# Patient Record
Sex: Female | Born: 1940
Health system: Southern US, Community
[De-identification: ages and names within clinical notes are randomized; demographics above are authoritative.]

## PROBLEM LIST (undated history)

## (undated) DIAGNOSIS — I2510 Atherosclerotic heart disease of native coronary artery without angina pectoris: Secondary | ICD-10-CM

## (undated) DIAGNOSIS — J45909 Unspecified asthma, uncomplicated: Secondary | ICD-10-CM

## (undated) DIAGNOSIS — M797 Fibromyalgia: Secondary | ICD-10-CM

## (undated) DIAGNOSIS — E785 Hyperlipidemia, unspecified: Secondary | ICD-10-CM

## (undated) DIAGNOSIS — Z8719 Personal history of other diseases of the digestive system: Secondary | ICD-10-CM

## (undated) DIAGNOSIS — K219 Gastro-esophageal reflux disease without esophagitis: Secondary | ICD-10-CM

## (undated) DIAGNOSIS — J449 Chronic obstructive pulmonary disease, unspecified: Secondary | ICD-10-CM

## (undated) DIAGNOSIS — I10 Essential (primary) hypertension: Secondary | ICD-10-CM

## (undated) DIAGNOSIS — C4490 Unspecified malignant neoplasm of skin, unspecified: Secondary | ICD-10-CM

## (undated) DIAGNOSIS — F419 Anxiety disorder, unspecified: Secondary | ICD-10-CM

## (undated) DIAGNOSIS — M1990 Unspecified osteoarthritis, unspecified site: Secondary | ICD-10-CM

## (undated) DIAGNOSIS — F32A Depression, unspecified: Secondary | ICD-10-CM

## (undated) DIAGNOSIS — R1310 Dysphagia, unspecified: Secondary | ICD-10-CM

## (undated) DIAGNOSIS — Z973 Presence of spectacles and contact lenses: Secondary | ICD-10-CM

## (undated) DIAGNOSIS — Z972 Presence of dental prosthetic device (complete) (partial): Secondary | ICD-10-CM

## (undated) DIAGNOSIS — S4292XA Fracture of left shoulder girdle, part unspecified, initial encounter for closed fracture: Secondary | ICD-10-CM

## (undated) DIAGNOSIS — J189 Pneumonia, unspecified organism: Secondary | ICD-10-CM

## (undated) DIAGNOSIS — R0600 Dyspnea, unspecified: Secondary | ICD-10-CM

## (undated) HISTORY — PX: UPPER GI ENDOSCOPY: SHX6162

## (undated) HISTORY — PX: HEMORRHOID SURGERY: SHX153

## (undated) HISTORY — PX: COLONOSCOPY: SHX174

## (undated) HISTORY — PX: ABDOMINAL HYSTERECTOMY: SHX81

---

## 1999-05-26 ENCOUNTER — Encounter: Payer: Self-pay | Admitting: Internal Medicine

## 1999-05-26 ENCOUNTER — Encounter: Admission: RE | Admit: 1999-05-26 | Discharge: 1999-05-26 | Payer: Self-pay | Admitting: Internal Medicine

## 1999-07-11 ENCOUNTER — Ambulatory Visit (HOSPITAL_BASED_OUTPATIENT_CLINIC_OR_DEPARTMENT_OTHER): Admission: RE | Admit: 1999-07-11 | Discharge: 1999-07-11 | Payer: Self-pay | Admitting: Surgery

## 1999-07-11 ENCOUNTER — Encounter (INDEPENDENT_AMBULATORY_CARE_PROVIDER_SITE_OTHER): Payer: Self-pay | Admitting: *Deleted

## 1999-09-21 ENCOUNTER — Encounter: Payer: Self-pay | Admitting: Emergency Medicine

## 1999-09-21 ENCOUNTER — Inpatient Hospital Stay (HOSPITAL_COMMUNITY): Admission: EM | Admit: 1999-09-21 | Discharge: 1999-09-24 | Payer: Self-pay | Admitting: Emergency Medicine

## 2000-01-28 ENCOUNTER — Other Ambulatory Visit (HOSPITAL_COMMUNITY): Admission: RE | Admit: 2000-01-28 | Discharge: 2000-02-16 | Payer: Self-pay | Admitting: Psychiatry

## 2000-06-29 ENCOUNTER — Ambulatory Visit (HOSPITAL_COMMUNITY): Admission: RE | Admit: 2000-06-29 | Discharge: 2000-06-29 | Payer: Self-pay | Admitting: *Deleted

## 2000-06-29 ENCOUNTER — Encounter (INDEPENDENT_AMBULATORY_CARE_PROVIDER_SITE_OTHER): Payer: Self-pay | Admitting: Specialist

## 2000-08-19 ENCOUNTER — Ambulatory Visit: Admission: RE | Admit: 2000-08-19 | Discharge: 2000-08-19 | Payer: Self-pay | Admitting: Pulmonary Disease

## 2000-09-16 ENCOUNTER — Ambulatory Visit (HOSPITAL_COMMUNITY): Admission: RE | Admit: 2000-09-16 | Discharge: 2000-09-16 | Payer: Self-pay | Admitting: Pulmonary Disease

## 2000-09-16 ENCOUNTER — Encounter: Payer: Self-pay | Admitting: Pulmonary Disease

## 2003-07-02 ENCOUNTER — Encounter: Admission: RE | Admit: 2003-07-02 | Discharge: 2003-07-02 | Payer: Self-pay | Admitting: Internal Medicine

## 2004-07-25 ENCOUNTER — Encounter: Admission: RE | Admit: 2004-07-25 | Discharge: 2004-07-25 | Payer: Self-pay | Admitting: Internal Medicine

## 2004-08-08 ENCOUNTER — Ambulatory Visit (HOSPITAL_COMMUNITY): Admission: RE | Admit: 2004-08-08 | Discharge: 2004-08-08 | Payer: Self-pay | Admitting: *Deleted

## 2005-03-30 ENCOUNTER — Emergency Department (HOSPITAL_COMMUNITY): Admission: EM | Admit: 2005-03-30 | Discharge: 2005-03-30 | Payer: Self-pay | Admitting: *Deleted

## 2007-11-30 ENCOUNTER — Encounter (HOSPITAL_COMMUNITY): Admission: RE | Admit: 2007-11-30 | Discharge: 2007-12-22 | Payer: Self-pay | Admitting: Endocrinology

## 2008-03-04 ENCOUNTER — Emergency Department (HOSPITAL_COMMUNITY): Admission: EM | Admit: 2008-03-04 | Discharge: 2008-03-04 | Payer: Self-pay | Admitting: Emergency Medicine

## 2009-12-29 ENCOUNTER — Observation Stay (HOSPITAL_COMMUNITY): Admission: EM | Admit: 2009-12-29 | Discharge: 2009-12-29 | Payer: Self-pay | Admitting: Emergency Medicine

## 2009-12-29 ENCOUNTER — Emergency Department (HOSPITAL_COMMUNITY): Admission: EM | Admit: 2009-12-29 | Discharge: 2009-12-29 | Payer: Self-pay | Admitting: Family Medicine

## 2010-05-09 ENCOUNTER — Inpatient Hospital Stay (INDEPENDENT_AMBULATORY_CARE_PROVIDER_SITE_OTHER)
Admission: RE | Admit: 2010-05-09 | Discharge: 2010-05-09 | Disposition: A | Discharge status: Home / Self Care | Payer: BC Managed Care – HMO | Source: Ambulatory Visit | Attending: Family Medicine | Admitting: Family Medicine

## 2010-05-09 DIAGNOSIS — T6391XA Toxic effect of contact with unspecified venomous animal, accidental (unintentional), initial encounter: Secondary | ICD-10-CM

## 2010-06-19 LAB — POCT I-STAT, CHEM 8
Creatinine, Ser: 0.7 mg/dL (ref 0.4–1.2)
HCT: 47 % — ABNORMAL HIGH (ref 36.0–46.0)
Hemoglobin: 16 g/dL — ABNORMAL HIGH (ref 12.0–15.0)
Potassium: 3.8 mEq/L (ref 3.5–5.1)
Sodium: 140 mEq/L (ref 135–145)
TCO2: 25 mmol/L (ref 0–100)

## 2010-06-19 LAB — CBC
MCH: 29.7 pg (ref 26.0–34.0)
MCHC: 33.7 g/dL (ref 30.0–36.0)
MCV: 88.3 fL (ref 78.0–100.0)
Platelets: 219 10*3/uL (ref 150–400)
RBC: 5.11 MIL/uL (ref 3.87–5.11)
RDW: 13.9 % (ref 11.5–15.5)

## 2010-06-19 LAB — DIFFERENTIAL
Basophils Relative: 1 % (ref 0–1)
Eosinophils Absolute: 0 10*3/uL (ref 0.0–0.7)
Eosinophils Relative: 0 % (ref 0–5)
Lymphs Abs: 0.7 10*3/uL (ref 0.7–4.0)
Neutrophils Relative %: 85 % — ABNORMAL HIGH (ref 43–77)

## 2010-06-19 LAB — POCT CARDIAC MARKERS: Myoglobin, poc: 166 ng/mL (ref 12–200)

## 2010-08-22 NOTE — Discharge Summary (Signed)
. [facility]  Patient:    Elizabeth Velazquez, Elizabeth Velazquez                      MRN: [id number] Adm. Date:  [id number] Disc. Date: [id number] Attending:  Londell Moh CC:         Soyla Murphy. Renne Crigler, M.D.   Discharge Summary  DISCHARGE DIAGNOSIS:  Acute bronchitis.  SECONDARY DIAGNOSIS:  Tobacco abuse.  The patient was admitted to [facility] D. Renne Crigler, M.D.  CONSULTANTS:  None.  PROCEDURES:  None.  HISTORY OF PRESENT ILLNESS:  Please see Dr. Shellia Cleverly history and physical.  The patient is a 70 year old woman with a history of tobacco abuse who came in with acute exacerbation of asthmatic bronchitis.  The patient was found to have ABG showing partial pressure of oxygen of 58 on room air and labored breathing.  HOSPITAL COURSE:  #1 -  ASTHMATIC BRONCHITIS:  Mrs. Elizabeth Velazquez was admitted to the hospital for treatment of an asthmatic bronchitis.  Initial pO2 was 58 mmHg on room air with O2 saturations approximately 90% with labored breathing. The patient was found to have a slight leukocytosis with 11,000 white cells.  The patient was started on IV Solu-Medrol and azithromycin.  The patient was afebrile.  After several days of IV steroids, the patients lung exam which initially had been very wheezy and bronchospastic, improved; and the patient had decreased work of breathing.  The patient was changed to oral prednisone and was to continue azithromycin Z-pack until completion at the time of discharge.  The patient was much improved at the time of discharge.  O2 saturations were 96% on room air and work of breathing was within normal range.  The patient was to be tapered on steroids over one week.  #2 - HYPOKALEMIA:  The patient did have a decreased potassium on initial evaluation. The reasons for this are not entirely clear.  Her potassium was 3.2.  The patient had no symptoms.  At discharge, the patients potassium was 3.3. This will need to be followed up as an  outpatient.  #3 - HYPERGLYCEMIA:  The patient was found to have a slightly elevated blood sugar on initial evaluation.  Her glucose was 135.  At the time of discharge the patients glucose was 267.  The patient was not on glucose lowering agents.  It was felt that she was probably borderline and unfortunately the steroids probably caused an increase in her blood glucose.  This will need to be followed up as an outpatient. The patient is probably borderline diabetic and may need pharmacologic therapy in the very near future.  My suspicion is that once the patient is discontinued off the steroids, then her sugar should get to a more normal range; however, this will need to be followed up.  #4 - ANXIETY:  The patient has a history of depression/anxiety.  The patient is on Effexor and this should be continued as an outpatient.  DISCHARGE MEDICATIONS: 1. Prednisone taper 60 mg over six days. 2. Albuterol and Atrovent MDIs two puffs q.6h. 3. Azithromycin 250 mg q.d. x 2 days. 4. Valium 5 mg t.i.d. p.r.n.  FOLLOW-UP:  The patient will see Dr. Renne Crigler in one to two weeks.  CONDITION ON DISCHARGE:  Improved. DD:  11/20/99 TD:  11/21/99 Job: [id number]

## 2010-08-22 NOTE — Op Note (Signed)
[place]. [facility]  Patient:    Elizabeth Velazquez, Elizabeth Velazquez                      MRN: [id number] Proc. Date: 06/29/00 Adm. Date:  [id number] Attending:  Sabino Gasser                           Operative Report  PROCEDURE:  Colonoscopy.  INDICATIONS:  Colon polyp.  ANESTHESIA:  Demerol 60 mg, Versed 6 mg.  DESCRIPTION OF PROCEDURE:  With the patient mildly sedated in the left lateral decubitus position, the Olympus videoscopic colonoscope was inserted in the rectum and passed under direct vision to the cecum.  The cecum identified by the ileocecal valve and appendiceal valve both of which are photographed. From this point, the colonoscope was slowly withdrawn taking circumferential views of the entire colonic mucosa stopping to photograph diverticula in the sigmoid colon until we reached the rectum which appeared normal on direct view and showed a polyp on retroflex view and direct view which was then photographed, biopsied, using hot biopsy forceps technique and removed.  The endoscope was withdrawn.  The patients vital signs and pulse oximeter remained stable.  The patient tolerated the procedure well with no apparent complications.  FINDINGS:  Polyp of rectum removed.  Await biopsy report.  The patient will call me for results and follow up with me as an outpatient. DD:  06/29/00 TD:  06/29/00 Job: [id number]

## 2010-08-22 NOTE — Op Note (Signed)
[place]. [facility]  Patient:    Elizabeth Velazquez, Elizabeth Velazquez                      MRN: [id number] Proc. Date: 07/11/99 Adm. Date:  [id number] Attending:  Andre Lefort CC:         Soyla Murphy. Renne Crigler, M.D.             Jeralyn Ruths, M.D.                           Operative Report  DATE OF BIRTH:  10-23-1940  PREOPERATIVE DIAGNOSIS:  Probable papilloma, left breast nipple.  POSTOPERATIVE DIAGNOSIS:  Probable papilloma, left breast nipple, final pathology pending.  PROCEDURE:  Excisional breast biopsy of left breast.  SURGEON:  Sandria Bales. Ezzard Standing, M.D.  FIRST ASSISTANT:  None.  ANESTHESIA:  15 cc of 1% Xylocaine with MAC.  COMPLICATIONS:  None.  INDICATIONS FOR PROCEDURE:  The patient is a 70 year old white female who presents with a mass in her left nipple areola area.  She had a negative mammogram.  An ultrasound showed this to be a solid appearing mass which transilluminated well. The feeling is that this is probable a papilloma.  She comes for excision of this mass.  DESCRIPTION OF PROCEDURE:  The patient was placed in the supine position.  The eft breast was prepped with Betadine solution and sterilely draped.  The patient had a 1 cm mass right at the 12 oclock position of the left nipple in the areolar complex.  The skin was infiltrated with approximately 20 cc of 1% Xylocaine. An elliptical incision was made, excising the mass with attempt to try to get some  normal tissue around it.  This was sent to pathology.  THe wound was then closed with interrupted 5-0 Vicryl suture in the subcutaneous tissue then 5-0 Vicryl running suture in the skin layer, painted with tincture of benzoin and Steri-Strips and sterilely dressed.  The patient tolerated the procedure well and was transported to the recovery room in good condition. DD:  07/11/99 TD:  07/11/99 Job: [id number]

## 2010-08-22 NOTE — Op Note (Signed)
[name], Elizabeth Velazquez NO.:  [id number]   MEDICAL RECORD NO.:  [id number]          PATIENT TYPE:  AMB   LOCATION:  ENDO                         FACILITY:  [facility]   PHYSICIAN:  Georgiana Spinner, M.D.    DATE OF BIRTH:  1941-02-26   DATE OF PROCEDURE:  08/08/2004  DATE OF DISCHARGE:                                 OPERATIVE REPORT   PROCEDURE:  Upper endoscopy.   INDICATIONS FOR PROCEDURE:  Dysphagia.   ANESTHESIA:  Demerol 40, Versed 4 mg.   DESCRIPTION OF PROCEDURE:  With the patient mildly sedated in the left  lateral decubitus position, the Olympus videoscopic endoscope was inserted  in the mouth and passed under direct vision through the esophagus.  There  was no evidence of Barrett's.  We entered into the stomach, and there was  food seen in the fundus.  In fact, most of the stomach, body, and antrum  were visualized, and there was food actually refluxing back from the  duodenum through the pylorus which were able to enter and get to the second  portion of the duodenum.  From this point, the endoscope was withdrawn,  taking circumferential views of the duodenal mucosa as best as we could  visualize until the endoscope had been pulled back into the stomach, placed  in retroflexion, viewing the stomach from below.  The endoscope was then  straightened and withdrawn, taking circumferential views of the remaining  gastric and esophageal mucosa.  The patient's vital signs and pulse oximetry  remained stable.   The patient tolerated the procedure well without apparent complications.   FINDINGS:  Food left in the stomach.  The patient's history is that she had  not eaten since midnight, so therefore she has a significant degree of  gastroparesis.  We will discuss this further with her and consider a therapy  directed towards this.      GMO/MEDQ  D:  08/08/2004  T:  08/08/2004  Job:  [id number]

## 2010-09-23 ENCOUNTER — Emergency Department (HOSPITAL_COMMUNITY)
Admission: EM | Admit: 2010-09-23 | Discharge: 2010-09-23 | Disposition: A | Discharge status: Home / Self Care | Payer: Medicare Other | Attending: Emergency Medicine | Admitting: Emergency Medicine

## 2010-09-23 DIAGNOSIS — J449 Chronic obstructive pulmonary disease, unspecified: Secondary | ICD-10-CM | POA: Insufficient documentation

## 2010-09-23 DIAGNOSIS — L500 Allergic urticaria: Secondary | ICD-10-CM | POA: Insufficient documentation

## 2010-09-23 DIAGNOSIS — L2989 Other pruritus: Secondary | ICD-10-CM | POA: Insufficient documentation

## 2010-09-23 DIAGNOSIS — R0602 Shortness of breath: Secondary | ICD-10-CM | POA: Insufficient documentation

## 2010-09-23 DIAGNOSIS — L298 Other pruritus: Secondary | ICD-10-CM | POA: Insufficient documentation

## 2010-09-23 DIAGNOSIS — IMO0001 Reserved for inherently not codable concepts without codable children: Secondary | ICD-10-CM | POA: Insufficient documentation

## 2010-09-23 DIAGNOSIS — J4489 Other specified chronic obstructive pulmonary disease: Secondary | ICD-10-CM | POA: Insufficient documentation

## 2010-09-23 DIAGNOSIS — T63461A Toxic effect of venom of wasps, accidental (unintentional), initial encounter: Secondary | ICD-10-CM | POA: Insufficient documentation

## 2010-09-23 DIAGNOSIS — T6391XA Toxic effect of contact with unspecified venomous animal, accidental (unintentional), initial encounter: Secondary | ICD-10-CM | POA: Insufficient documentation

## 2011-05-21 ENCOUNTER — Other Ambulatory Visit: Payer: Self-pay | Admitting: Gastroenterology

## 2012-03-05 ENCOUNTER — Emergency Department (HOSPITAL_COMMUNITY)
Admission: EM | Admit: 2012-03-05 | Discharge: 2012-03-05 | Disposition: A | Discharge status: Home / Self Care | Payer: Medicare Other | Source: Home / Self Care | Attending: Emergency Medicine | Admitting: Emergency Medicine

## 2012-03-05 ENCOUNTER — Encounter (HOSPITAL_COMMUNITY): Payer: Self-pay | Admitting: Emergency Medicine

## 2012-03-05 DIAGNOSIS — F411 Generalized anxiety disorder: Secondary | ICD-10-CM

## 2012-03-05 DIAGNOSIS — L509 Urticaria, unspecified: Secondary | ICD-10-CM

## 2012-03-05 DIAGNOSIS — F419 Anxiety disorder, unspecified: Secondary | ICD-10-CM

## 2012-03-05 HISTORY — DX: Anxiety disorder, unspecified: F41.9

## 2012-03-05 MED ORDER — TRIAMCINOLONE ACETONIDE 40 MG/ML IJ SUSP
INTRAMUSCULAR | Status: AC
  Filled 2012-03-05: qty 5

## 2012-03-05 MED ORDER — LORAZEPAM 1 MG PO TABS: 0.5000 mg | ORAL_TABLET | Freq: Three times a day (TID) | ORAL | Status: DC | PRN

## 2012-03-05 MED ORDER — PREDNISONE 10 MG PO TABS: 20.0000 mg | ORAL_TABLET | Freq: Two times a day (BID) | ORAL | Status: DC

## 2012-03-05 MED ORDER — TRIAMCINOLONE ACETONIDE 40 MG/ML IJ SUSP: 60.0000 mg | Freq: Once | INTRAMUSCULAR | Status: DC

## 2012-03-05 MED ORDER — TRIAMCINOLONE ACETONIDE 40 MG/ML IJ SUSP
40.0000 mg | Freq: Once | INTRAMUSCULAR | Status: AC
  Administered 2012-03-05: 40 mg via INTRAMUSCULAR

## 2012-03-05 NOTE — ED Notes (Signed)
Pt c/o hives x2 weeks but getting worse the last 3/4 days... Sx include: hives on hands, arms, face and describes itching as burning.Marland KitchenMarland KitchenDenies: fevers, vomiting, nauseas, diarrhea... States she just lost a very good friend and attributes the hives to nerves???

## 2012-03-05 NOTE — ED Provider Notes (Signed)
History     CSN: [id number]  Arrival date & time 03/05/12  0903   First MD Initiated Contact with Patient 03/05/12 [phone]      Chief Complaint  Patient presents with  . Urticaria    (Consider location/radiation/quality/duration/timing/severity/associated sxs/prior treatment) Patient is a 71 y.o. female presenting with urticaria. The history is provided by the patient.  Urticaria This is a new problem. The current episode started more than 1 week ago. The problem occurs daily. The problem has not changed since onset.Pertinent negatives include no chest pain, no abdominal pain, no headaches and no shortness of breath. Nothing aggravates the symptoms. Nothing relieves the symptoms. Treatments tried: Benadryl. The treatment provided mild relief.  Reports hx of hives with emotional distress, had a close friend pass recently.    Past Medical History  Diagnosis Date  . Anxiety     History reviewed. No pertinent past surgical history.  No family history on file.  History  Substance Use Topics  . Smoking status: Never Smoker   . Smokeless tobacco: Not on file  . Alcohol Use: No    OB History    Grav Para Term Preterm Abortions TAB SAB Ect Mult Living                  Review of Systems  Respiratory: Negative for shortness of breath.   Cardiovascular: Negative for chest pain.  Gastrointestinal: Negative for abdominal pain.  Skin: Positive for rash.  Neurological: Negative for headaches.  All other systems reviewed and are negative.    Allergies  Penicillins  Home Medications   Current Outpatient Rx  Name  Route  Sig  Dispense  Refill  . VENLAFAXINE HCL ER 150 MG PO CP24   Oral   Take 150 mg by mouth daily.         . ALBUTEROL SULFATE (2.5 MG/3ML) 0.083% IN NEBU   Nebulization   Take 2.5 mg by nebulization every 6 (six) hours as needed.         [place] IN   Inhalation   Inhale into the lungs.         Marland Kitchen FLEXERIL PO   Oral   Take by mouth.         Marland Kitchen LORAZEPAM 1 MG PO TABS   Oral   Take 0.5 tablets (0.5 mg total) by mouth 3 (three) times daily as needed for anxiety.   15 tablet   0   . PREDNISONE 10 MG PO TABS   Oral   Take 2 tablets (20 mg total) by mouth 2 (two) times daily.   6 tablet   0     BP 163/90  Pulse 90  Temp 98.9 F (37.2 C) (Oral)  Resp 18  SpO2 96%  Physical Exam  Nursing note and vitals reviewed. Constitutional: She is oriented to person, place, and time. Vital signs are normal. She appears well-developed and well-nourished. She is active and cooperative.  HENT:  Head: Normocephalic.  Right Ear: External ear normal.  Left Ear: External ear normal.  Nose: Nose normal.  Mouth/Throat: Oropharynx is clear and moist. No oropharyngeal exudate.  Eyes: Conjunctivae normal are normal. Pupils are equal, round, and reactive to light. No scleral icterus.  Neck: Trachea normal and normal range of motion. Neck supple.  Cardiovascular: Normal rate, regular rhythm, normal heart sounds and intact distal pulses.   No murmur heard. Pulmonary/Chest: Effort normal and breath sounds normal.  Lymphadenopathy:    She has no cervical  adenopathy.  Neurological: She is alert and oriented to person, place, and time. No cranial nerve deficit or sensory deficit.  Skin: Skin is warm and dry. Rash noted.       Erythematous rash on face and neck  Psychiatric: She has a normal mood and affect. Her speech is normal and behavior is normal. Judgment and thought content normal. Cognition and memory are normal.    ED Course  Procedures (including critical care time)  Labs Reviewed - No data to display No results found.   1. Anxiety   2. Hives       MDM  Take medication as prescribed, continue benadryl, follow up with your PCP this week for effective management of situational anxiety.          Johnsie Kindred, NP 03/05/12 1039

## 2012-03-05 NOTE — ED Provider Notes (Signed)
Medical screening examination/treatment/procedure(s) were performed by non-physician practitioner and as supervising physician I was immediately available for consultation/collaboration.  Leslee Home, M.D.   Reuben Likes, MD 03/05/12 1046

## 2014-09-07 ENCOUNTER — Emergency Department (HOSPITAL_COMMUNITY)
Admission: EM | Admit: 2014-09-07 | Discharge: 2014-09-07 | Disposition: A | Discharge status: Home / Self Care | Payer: Medicare Other | Attending: Emergency Medicine | Admitting: Emergency Medicine

## 2014-09-07 ENCOUNTER — Encounter (HOSPITAL_COMMUNITY): Payer: Self-pay | Admitting: *Deleted

## 2014-09-07 DIAGNOSIS — Y998 Other external cause status: Secondary | ICD-10-CM | POA: Insufficient documentation

## 2014-09-07 DIAGNOSIS — T7840XA Allergy, unspecified, initial encounter: Secondary | ICD-10-CM

## 2014-09-07 DIAGNOSIS — Y9289 Other specified places as the place of occurrence of the external cause: Secondary | ICD-10-CM | POA: Insufficient documentation

## 2014-09-07 DIAGNOSIS — Y9389 Activity, other specified: Secondary | ICD-10-CM | POA: Insufficient documentation

## 2014-09-07 DIAGNOSIS — Z880 Allergy status to penicillin: Secondary | ICD-10-CM | POA: Diagnosis not present

## 2014-09-07 DIAGNOSIS — Z7951 Long term (current) use of inhaled steroids: Secondary | ICD-10-CM | POA: Diagnosis not present

## 2014-09-07 DIAGNOSIS — F419 Anxiety disorder, unspecified: Secondary | ICD-10-CM | POA: Diagnosis not present

## 2014-09-07 DIAGNOSIS — J449 Chronic obstructive pulmonary disease, unspecified: Secondary | ICD-10-CM | POA: Insufficient documentation

## 2014-09-07 DIAGNOSIS — Z79899 Other long term (current) drug therapy: Secondary | ICD-10-CM | POA: Diagnosis not present

## 2014-09-07 DIAGNOSIS — T63464A Toxic effect of venom of wasps, undetermined, initial encounter: Secondary | ICD-10-CM | POA: Diagnosis present

## 2014-09-07 MED ORDER — PREDNISONE 20 MG PO TABS: ORAL_TABLET | ORAL | Status: DC

## 2014-09-07 NOTE — ED Notes (Signed)
Patient returned to nurse first desk, still jittering but sts is ready to go. Patient informed she is next to be taken back unless more emergent condition enters. Patient agreeable to stay and RN informed to return if she feels she cant wait any longer.

## 2014-09-07 NOTE — ED Notes (Addendum)
Pt appears to be in no distress. Pt states she's ready to go home. Pt continues to remain jittery and is shaking, pt states this is not normal for her.

## 2014-09-07 NOTE — Discharge Instructions (Signed)
Allergies °Allergies may happen from anything your body is sensitive to. This may be food, medicines, pollens, chemicals, and nearly anything around you in everyday life that produces allergens. An allergen is anything that causes an allergy producing substance. Heredity is often a factor in causing these problems. This means you may have some of the same allergies as your parents. °Food allergies happen in all age groups. Food allergies are some of the most severe and life threatening. Some common food allergies are cow's milk, seafood, eggs, nuts, wheat, and soybeans. °SYMPTOMS  °· Swelling around the mouth. °· An itchy red rash or hives. °· Vomiting or diarrhea. °· Difficulty breathing. °SEVERE ALLERGIC REACTIONS ARE LIFE-THREATENING. °This reaction is called anaphylaxis. It can cause the mouth and throat to swell and cause difficulty with breathing and swallowing. In severe reactions only a trace amount of food (for example, peanut oil in a salad) may cause death within seconds. °Seasonal allergies occur in all age groups. These are seasonal because they usually occur during the same season every year. They may be a reaction to molds, grass pollens, or tree pollens. Other causes of problems are house dust mite allergens, pet dander, and mold spores. The symptoms often consist of nasal congestion, a runny itchy nose associated with sneezing, and tearing itchy eyes. There is often an associated itching of the mouth and ears. The problems happen when you come in contact with pollens and other allergens. Allergens are the particles in the air that the body reacts to with an allergic reaction. This causes you to release allergic antibodies. Through a chain of events, these eventually cause you to release histamine into the blood stream. Although it is meant to be protective to the body, it is this release that causes your discomfort. This is why you were given anti-histamines to feel better.  If you are unable to  pinpoint the offending allergen, it may be determined by skin or blood testing. Allergies cannot be cured but can be controlled with medicine. °Hay fever is a collection of all or some of the seasonal allergy problems. It may often be treated with simple over-the-counter medicine such as diphenhydramine. Take medicine as directed. Do not drink alcohol or drive while taking this medicine. Check with your caregiver or package insert for child dosages. °If these medicines are not effective, there are many new medicines your caregiver can prescribe. Stronger medicine such as nasal spray, eye drops, and corticosteroids may be used if the first things you try do not work well. Other treatments such as immunotherapy or desensitizing injections can be used if all else fails. Follow up with your caregiver if problems continue. These seasonal allergies are usually not life threatening. They are generally more of a nuisance that can often be handled using medicine. °HOME CARE INSTRUCTIONS  °· If unsure what causes a reaction, keep a diary of foods eaten and symptoms that follow. Avoid foods that cause reactions. °· If hives or rash are present: °¨ Take medicine as directed. °¨ You may use an over-the-counter antihistamine (diphenhydramine) for hives and itching as needed. °¨ Apply cold compresses (cloths) to the skin or take baths in cool water. Avoid hot baths or showers. Heat will make a rash and itching worse. °· If you are severely allergic: °¨ Following a treatment for a severe reaction, hospitalization is often required for closer follow-up. °¨ Wear a medic-alert bracelet or necklace stating the allergy. °¨ You and your family must learn how to give adrenaline or use   an anaphylaxis kit.  If you have had a severe reaction, always carry your anaphylaxis kit or EpiPen with you. Use this medicine as directed by your caregiver if a severe reaction is occurring. Failure to do so could have a fatal outcome. SEEK MEDICAL  CARE IF:  You suspect a food allergy. Symptoms generally happen within 30 minutes of eating a food.  Your symptoms have not gone away within 2 days or are getting worse.  You develop new symptoms.  You want to retest yourself or your child with a food or drink you think causes an allergic reaction. Never do this if an anaphylactic reaction to that food or drink has happened before. Only do this under the care of a caregiver. SEEK IMMEDIATE MEDICAL CARE IF:   You have difficulty breathing, are wheezing, or have a tight feeling in your chest or throat.  You have a swollen mouth, or you have hives, swelling, or itching all over your body.  You have had a severe reaction that has responded to your anaphylaxis kit or an EpiPen. These reactions may return when the medicine has worn off. These reactions should be considered life threatening. MAKE SURE YOU:   Understand these instructions.  Will watch your condition.  Will get help right away if you are not doing well or get worse. Document Released: 06/16/2002 Document Revised: 07/18/2012 Document Reviewed: 11/21/2007 [facility] Patient Information 2015 [place], [place]. This information is not intended to replace advice given to you by your health care provider. Make sure you discuss any questions you have with your health care provider.   Please take your medication as directed. Please follow-up with her primary care for further evaluation and management of her symptoms. Return to ED for new or worsening symptoms.

## 2014-09-07 NOTE — ED Provider Notes (Signed)
CSN: [id number]     Arrival date & time 09/07/14  1421 History   First MD Initiated Contact with Patient 09/07/14 1536     Chief Complaint  Patient presents with  . Allergic Reaction     (Consider location/radiation/quality/duration/timing/severity/associated sxs/prior Treatment) HPI Elizabeth Velazquez is a 74 y.o. female with history of COPD, comes in for evaluation of wasp sting. Patient states approximately 2:00 this afternoon she was stung on her right side of her chest. She reports using her EpiPen within a few minutes. She reports a known allergy to bee stings in general, but has never used an EpiPen before. She reports she was stung "many many years ago", was seen in the Hospital and was told her "lungs were closing up" and that she needed to carry an EpiPen. She at no point experienced shortness of breath, difficulties breathing, nausea or vomiting, abdominal pain or cramping, skin rash. Denies any pain or discomfort now. States her son will be staying with her this evening and she is ready to go home.  Past Medical History  Diagnosis Date  . Anxiety    History reviewed. No pertinent past surgical history. History reviewed. No pertinent family history. History  Substance Use Topics  . Smoking status: Never Smoker   . Smokeless tobacco: Not on file  . Alcohol Use: No   OB History    No data available     Review of Systems A 10 point review of systems was completed and was negative except for pertinent positives and negatives as mentioned in the history of present illness     Allergies  Penicillins  Home Medications   Prior to Admission medications   Medication Sig Start Date End Date Taking? Authorizing Provider  albuterol (PROVENTIL HFA;VENTOLIN HFA) 108 (90 BASE) MCG/ACT inhaler Inhale 1 puff into the lungs every 6 (six) hours as needed for shortness of breath.   Yes Historical Provider, MD  albuterol (PROVENTIL) (2.5 MG/3ML) 0.083% nebulizer solution Take 2.5 mg by  nebulization every 6 (six) hours as needed for shortness of breath.    Yes Historical Provider, MD  Budesonide-Formoterol Fumarate (SYMBICORT IN) Inhale 1 puff into the lungs daily.    Yes Historical Provider, MD  Cholecalciferol (VITAMIN D3) 2000 UNITS TABS Take 1 tablet by mouth daily.   Yes Historical Provider, MD  HYDROcodone-acetaminophen (NORCO/VICODIN) 5-325 MG per tablet Take 1 tablet by mouth every 6 (six) hours as needed for moderate pain.   Yes Historical Provider, MD  magnesium gluconate (MAGONATE) 500 MG tablet Take 500 mg by mouth daily.   Yes Historical Provider, MD  venlafaxine XR (EFFEXOR-XR) 150 MG 24 hr capsule Take 150 mg by mouth daily.   Yes Historical Provider, MD  LORazepam (ATIVAN) 1 MG tablet Take 0.5 tablets (0.5 mg total) by mouth 3 (three) times daily as needed for anxiety. Patient not taking: Reported on 09/07/2014 03/05/12   Awilda Metro, NP  predniSONE (DELTASONE) 20 MG tablet 3 tabs po day one, then 2 tabs daily x 4 days 09/07/14   Comer Locket, PA-C   BP 115/60 mmHg  Pulse 91  Temp(Src) 98.1 F (36.7 C) (Oral)  Resp 20  Ht 4\' 10"  (1.473 m)  Wt 122 lb (55.339 kg)  BMI 25.51 kg/m2  SpO2 96% Physical Exam  Constitutional: She is oriented to person, place, and time. She appears well-developed and well-nourished.  HENT:  Head: Normocephalic and atraumatic.  Mouth/Throat: Oropharynx is clear and moist.  Eyes: Conjunctivae are normal. Pupils  are equal, round, and reactive to light. Right eye exhibits no discharge. Left eye exhibits no discharge. No scleral icterus.  Neck: Neck supple.  Cardiovascular: Normal rate, regular rhythm and normal heart sounds.   Pulmonary/Chest: Effort normal and breath sounds normal. No respiratory distress. She has no wheezes. She has no rales.  Evidence of insect sting to right axillary line. Very localized, mild erythema and urticaria.  Abdominal: Soft. There is no tenderness.  Musculoskeletal: She exhibits no tenderness.   Neurological: She is alert and oriented to person, place, and time.  Cranial Nerves II-XII grossly intact  Skin: Skin is warm and dry. No rash noted.  Psychiatric: She has a normal mood and affect.  Nursing note and vitals reviewed.   ED Course  Procedures (including critical care time) Labs Review Labs Reviewed - No data to display  Imaging Review No results found.   EKG Interpretation   Date/Time:  Friday September 07 2014 14:27:52 EDT Ventricular Rate:  102 PR Interval:  136 QRS Duration: 84 QT Interval:  332 QTC Calculation: 432 R Axis:   75 Text Interpretation:  Sinus tachycardia Nonspecific ST abnormality  Abnormal ECG No significant change since last tracing Confirmed by YAO   MD, DAVID ([id number]) on 09/07/2014 3:49:42 PM      MDM  Vitals stable - WNL -afebrile Pt resting comfortably in ED. Patient remains asymptomatic in the ED. PE--localized erythema and urticaria consistent with insect bite/sting. Physical exam is otherwise unremarkable and not concerning for anaphylaxis. Patient presents to the ED for evaluation of allergic reaction and subsequent use of EpiPen. Patient appears well, with no signs or symptoms of anaphylaxis. We have monitored her condition in the ED and she is doing well. States that she is ready to go home. Will DC with short course steroid and encourage follow-up with PCP. I discussed all relevant lab findings and imaging results with pt and they verbalized understanding. Discussed f/u with PCP within 48 hrs and return precautions, pt very amenable to plan. Prior to patient discharge, I discussed and reviewed this case with Dr. Darl Householder, who also saw and evaluated the patient.    Final diagnoses:  Allergic reaction, initial encounter      Comer Locket, PA-C 09/07/14 [place] Yao, MD 09/08/14 [phone]

## 2014-09-07 NOTE — ED Notes (Signed)
Pt is in stable condition upon d/c and ambulates from ED. 

## 2014-09-07 NOTE — ED Notes (Signed)
Pt used EPI PEN 15 minutes ago, stung by wasp on right lower chest--- pt alert/oriented x 4, no resp distress

## 2015-05-15 DIAGNOSIS — J449 Chronic obstructive pulmonary disease, unspecified: Secondary | ICD-10-CM | POA: Diagnosis not present

## 2015-05-28 DIAGNOSIS — Z0000 Encounter for general adult medical examination without abnormal findings: Secondary | ICD-10-CM | POA: Diagnosis not present

## 2015-05-28 DIAGNOSIS — M150 Primary generalized (osteo)arthritis: Secondary | ICD-10-CM | POA: Diagnosis not present

## 2015-05-28 DIAGNOSIS — L570 Actinic keratosis: Secondary | ICD-10-CM | POA: Diagnosis not present

## 2015-05-28 DIAGNOSIS — M797 Fibromyalgia: Secondary | ICD-10-CM | POA: Diagnosis not present

## 2015-05-28 DIAGNOSIS — M62838 Other muscle spasm: Secondary | ICD-10-CM | POA: Diagnosis not present

## 2015-08-05 DIAGNOSIS — J449 Chronic obstructive pulmonary disease, unspecified: Secondary | ICD-10-CM | POA: Diagnosis not present

## 2015-08-26 DIAGNOSIS — M797 Fibromyalgia: Secondary | ICD-10-CM | POA: Diagnosis not present

## 2015-08-26 DIAGNOSIS — M150 Primary generalized (osteo)arthritis: Secondary | ICD-10-CM | POA: Diagnosis not present

## 2015-09-05 DIAGNOSIS — F329 Major depressive disorder, single episode, unspecified: Secondary | ICD-10-CM | POA: Diagnosis not present

## 2015-09-24 DIAGNOSIS — F329 Major depressive disorder, single episode, unspecified: Secondary | ICD-10-CM | POA: Diagnosis not present

## 2015-10-02 ENCOUNTER — Encounter (HOSPITAL_COMMUNITY): Payer: Self-pay | Admitting: Emergency Medicine

## 2015-10-02 ENCOUNTER — Emergency Department (HOSPITAL_COMMUNITY): Payer: Medicare Other

## 2015-10-02 ENCOUNTER — Emergency Department (HOSPITAL_COMMUNITY)
Admission: EM | Admit: 2015-10-02 | Discharge: 2015-10-02 | Disposition: A | Discharge status: Home / Self Care | Payer: Medicare Other | Attending: Emergency Medicine | Admitting: Emergency Medicine

## 2015-10-02 DIAGNOSIS — F17200 Nicotine dependence, unspecified, uncomplicated: Secondary | ICD-10-CM | POA: Diagnosis not present

## 2015-10-02 DIAGNOSIS — J441 Chronic obstructive pulmonary disease with (acute) exacerbation: Secondary | ICD-10-CM | POA: Diagnosis not present

## 2015-10-02 DIAGNOSIS — Z79899 Other long term (current) drug therapy: Secondary | ICD-10-CM | POA: Diagnosis not present

## 2015-10-02 DIAGNOSIS — R0602 Shortness of breath: Secondary | ICD-10-CM | POA: Diagnosis not present

## 2015-10-02 LAB — BASIC METABOLIC PANEL
Anion gap: 7 (ref 5–15)
BUN: 9 mg/dL (ref 6–20)
CALCIUM: 8.8 mg/dL — AB (ref 8.9–10.3)
CHLORIDE: 104 mmol/L (ref 101–111)
CO2: 27 mmol/L (ref 22–32)
CREATININE: 0.53 mg/dL (ref 0.44–1.00)
GFR calc non Af Amer: >60 mL/min (ref >60)
GLUCOSE: 146 mg/dL — AB (ref 65–99)
Potassium: 3.4 mmol/L — ABNORMAL LOW (ref 3.5–5.1)
Sodium: 138 mmol/L (ref 135–145)

## 2015-10-02 LAB — CBC
HCT: 43.8 % (ref 36.0–46.0)
Hemoglobin: 14 g/dL (ref 12.0–15.0)
MCH: 27.8 pg (ref 26.0–34.0)
MCHC: 32 g/dL (ref 30.0–36.0)
MCV: 86.9 fL (ref 78.0–100.0)
Platelets: 204 10*3/uL (ref 150–400)
RBC: 5.04 MIL/uL (ref 3.87–5.11)
RDW: 14.2 % (ref 11.5–15.5)
WBC: 6 10*3/uL (ref 4.0–10.5)

## 2015-10-02 LAB — BRAIN NATRIURETIC PEPTIDE: B Natriuretic Peptide: 57 pg/mL (ref 0.0–100.0)

## 2015-10-02 LAB — I-STAT TROPONIN, ED: Troponin i, poc: 0 ng/mL (ref 0.00–0.08)

## 2015-10-02 MED ORDER — IPRATROPIUM-ALBUTEROL 0.5-2.5 (3) MG/3ML IN SOLN
3.0000 mL | Freq: Once | RESPIRATORY_TRACT | Status: AC
  Administered 2015-10-02: 3 mL via RESPIRATORY_TRACT
  Filled 2015-10-02: qty 3

## 2015-10-02 MED ORDER — PREDNISONE 10 MG (21) PO TBPK: ORAL_TABLET | ORAL | Status: DC

## 2015-10-02 MED ORDER — PREDNISONE 20 MG PO TABS
60.0000 mg | ORAL_TABLET | Freq: Once | ORAL | Status: AC
  Administered 2015-10-02: 60 mg via ORAL
  Filled 2015-10-02: qty 3

## 2015-10-02 NOTE — Discharge Instructions (Signed)
Chronic Obstructive Pulmonary Disease Chronic obstructive pulmonary disease (COPD) is a common lung condition in which airflow from the lungs is limited. COPD is a general term that can be used to describe many different lung problems that limit airflow, including both chronic bronchitis and emphysema. If you have COPD, your lung function will probably never return to normal, but there are measures you can take to improve lung function and make yourself feel better. CAUSES   Smoking (common).  Exposure to secondhand smoke.  Genetic problems.  Chronic inflammatory lung diseases or recurrent infections. SYMPTOMS  Shortness of breath, especially with physical activity.  Deep, persistent (chronic) cough with a large amount of thick mucus.  Wheezing.  Rapid breaths (tachypnea).  Gray or bluish discoloration (cyanosis) of the skin, especially in your fingers, toes, or lips.  Fatigue.  Weight loss.  Frequent infections or episodes when breathing symptoms become much worse (exacerbations).  Chest tightness. DIAGNOSIS Your health care provider will take a medical history and perform a physical examination to diagnose COPD. Additional tests for COPD may include:  Lung (pulmonary) function tests.  Chest X-ray.  CT scan.  Blood tests. TREATMENT  Treatment for COPD may include:  Inhaler and nebulizer medicines. These help manage the symptoms of COPD and make your breathing more comfortable.  Supplemental oxygen. Supplemental oxygen is only helpful if you have a low oxygen level in your blood.  Exercise and physical activity. These are beneficial for nearly all people with COPD.  Lung surgery or transplant.  Nutrition therapy to gain weight, if you are underweight.  Pulmonary rehabilitation. This may involve working with a team of health care providers and specialists, such as respiratory, occupational, and physical therapists. HOME CARE INSTRUCTIONS  Take all medicines  (inhaled or pills) as directed by your health care provider.  Avoid over-the-counter medicines or cough syrups that dry up your airway (such as antihistamines) and slow down the elimination of secretions unless instructed otherwise by your health care provider.  If you are a smoker, the most important thing that you can do is stop smoking. Continuing to smoke will cause further lung damage and breathing trouble. Ask your health care provider for help with quitting smoking. He or she can direct you to community resources or hospitals that provide support.  Avoid exposure to irritants such as smoke, chemicals, and fumes that aggravate your breathing.  Use oxygen therapy and pulmonary rehabilitation if directed by your health care provider. If you require home oxygen therapy, ask your health care provider whether you should purchase a pulse oximeter to measure your oxygen level at home.  Avoid contact with individuals who have a contagious illness.  Avoid extreme temperature and humidity changes.  Eat healthy foods. Eating smaller, more frequent meals and resting before meals may help you maintain your strength.  Stay active, but balance activity with periods of rest. Exercise and physical activity will help you maintain your ability to do things you want to do.  Preventing infection and hospitalization is very important when you have COPD. Make sure to receive all the vaccines your health care provider recommends, especially the pneumococcal and influenza vaccines. Ask your health care provider whether you need a pneumonia vaccine.  Learn and use relaxation techniques to manage stress.  Learn and use controlled breathing techniques as directed by your health care provider. Controlled breathing techniques include:  Pursed lip breathing. Start by breathing in (inhaling) through your nose for 1 second. Then, purse your lips as if you were   going to whistle and breathe out (exhale) through the  pursed lips for 2 seconds.  Diaphragmatic breathing. Start by putting one hand on your abdomen just above your waist. Inhale slowly through your nose. The hand on your abdomen should move out. Then purse your lips and exhale slowly. You should be able to feel the hand on your abdomen moving in as you exhale.  Learn and use controlled coughing to clear mucus from your lungs. Controlled coughing is a series of short, progressive coughs. The steps of controlled coughing are: 1. Lean your head slightly forward. 2. Breathe in deeply using diaphragmatic breathing. 3. Try to hold your breath for 3 seconds. 4. Keep your mouth slightly open while coughing twice. 5. Spit any mucus out into a tissue. 6. Rest and repeat the steps once or twice as needed. SEEK MEDICAL CARE IF:  You are coughing up more mucus than usual.  There is a change in the color or thickness of your mucus.  Your breathing is more labored than usual.  Your breathing is faster than usual. SEEK IMMEDIATE MEDICAL CARE IF:  You have shortness of breath while you are resting.  You have shortness of breath that prevents you from:  Being able to talk.  Performing your usual physical activities.  You have chest pain lasting longer than 5 minutes.  Your skin color is more cyanotic than usual.  You measure low oxygen saturations for longer than 5 minutes with a pulse oximeter. MAKE SURE YOU:  Understand these instructions.  Will watch your condition.  Will get help right away if you are not doing well or get worse.   This information is not intended to replace advice given to you by your health care provider. Make sure you discuss any questions you have with your health care provider.   Document Released: 12/31/2004 Document Revised: 04/13/2014 Document Reviewed: 11/17/2012 Elsevier Interactive Patient Education 2016 [employer]  

## 2015-10-02 NOTE — ED Provider Notes (Signed)
CSN: [id number]     Arrival date & time 10/02/15  1057 History   First MD Initiated Contact with Patient 10/02/15 1157     Chief Complaint  Patient presents with  . Cough  . Shortness of Breath    HPI Comments: Sx started after she sprayed some round up in the yard on Monday.  That evening she started coughing.  She has soreness in her chest with coughing.  Her sx are worse with walking.  She called her doctor asking for some abx but her doctor wanted her to come to the ED to get evaluated.  Patient is a 75 y.o. female presenting with cough and shortness of breath. The history is provided by the patient.  Cough Cough characteristics:  Productive Sputum characteristics:  Clear Severity:  Moderate Onset quality:  Gradual Duration:  3 days Timing:  Constant Progression:  Worsening Chronicity:  Recurrent Relieved by:  Nothing Associated symptoms: shortness of breath   Associated symptoms: no chills and no fever   Shortness of Breath Associated symptoms: cough   Associated symptoms: no fever   She is a daily smoker.  She cant quit because it helps her with her fibromyalgia.  Past Medical History  Diagnosis Date  . Anxiety    History reviewed. No pertinent past surgical history. No family history on file. Social History  Substance Use Topics  . Smoking status: Current Every Day Smoker  . Smokeless tobacco: None  . Alcohol Use: No   OB History    No data available     Review of Systems  Constitutional: Negative for fever and chills.  Respiratory: Positive for cough and shortness of breath.   All other systems reviewed and are negative.     Allergies  Penicillins  Home Medications   Prior to Admission medications   Medication Sig Start Date End Date Taking? Authorizing Provider  albuterol (PROVENTIL HFA;VENTOLIN HFA) 108 (90 BASE) MCG/ACT inhaler Inhale 1 puff into the lungs every 6 (six) hours as needed for shortness of breath.   Yes Historical Provider, MD   albuterol (PROVENTIL) (2.5 MG/3ML) 0.083% nebulizer solution Take 2.5 mg by nebulization every 6 (six) hours as needed for shortness of breath.    Yes Historical Provider, MD  atorvastatin (LIPITOR) 20 MG tablet Take 20 mg by mouth daily.   Yes Historical Provider, MD  Budesonide-Formoterol Fumarate (SYMBICORT IN) Inhale 1 puff into the lungs daily.    Yes Historical Provider, MD  Cholecalciferol (VITAMIN D3) 2000 UNITS TABS Take 1 tablet by mouth daily.   Yes Historical Provider, MD  HYDROcodone-acetaminophen (NORCO/VICODIN) 5-325 MG per tablet Take 1 tablet by mouth every 6 (six) hours as needed for moderate pain.   Yes Historical Provider, MD  LORazepam (ATIVAN) 1 MG tablet Take 0.5 tablets (0.5 mg total) by mouth 3 (three) times daily as needed for anxiety. 03/05/12  Yes Awilda Metro, NP  TURMERIC PO Take 1 tablet by mouth daily.   Yes Historical Provider, MD  venlafaxine XR (EFFEXOR-XR) 150 MG 24 hr capsule Take 300 mg by mouth daily.    Yes Historical Provider, MD  predniSONE (STERAPRED UNI-PAK 21 TAB) 10 MG (21) TBPK tablet Take 6 tabs by mouth daily  for 2 days, then 5 tabs for 2 days, then 4 tabs for 2 days, then 3 tabs for 2 days, 2 tabs for 2 days, then 1 tab by mouth daily for 2 days 10/02/15   Dorie Rank, MD   BP 131/65 mmHg  Pulse 79  Temp(Src) 98.7 F (37.1 C) (Oral)  Resp 26  Ht 4\' 10"  (1.473 m)  Wt 54.432 kg  BMI 25.09 kg/m2  SpO2 93% Physical Exam  Constitutional: No distress.  HENT:  Head: Normocephalic and atraumatic.  Right Ear: External ear normal.  Left Ear: External ear normal.  Eyes: Conjunctivae are normal. Right eye exhibits no discharge. Left eye exhibits no discharge. No scleral icterus.  Neck: Neck supple. No tracheal deviation present.  Cardiovascular: Normal rate, regular rhythm and intact distal pulses.   Pulmonary/Chest: Effort normal. No stridor. No respiratory distress. She has wheezes. She has no rales.  Decreased air movement, few wheezes on end  expiration  Abdominal: Soft. Bowel sounds are normal. She exhibits no distension. There is no tenderness. There is no rebound and no guarding.  Musculoskeletal: She exhibits no edema or tenderness.  Neurological: She is alert. She has normal strength. No cranial nerve deficit (no facial droop, extraocular movements intact, no slurred speech) or sensory deficit. She exhibits normal muscle tone. She displays no seizure activity. Coordination normal.  Skin: Skin is warm and dry. No rash noted.  Psychiatric: She has a normal mood and affect.  Nursing note and vitals reviewed.   ED Course  Procedures   Medications given in the ED Medications  ipratropium-albuterol (DUONEB) 0.5-2.5 (3) MG/3ML nebulizer solution 3 mL (3 mLs Nebulization Given 10/02/15 1243)  predniSONE (DELTASONE) tablet 60 mg (60 mg Oral Given 10/02/15 1243)     Labs Review Labs Reviewed  BASIC METABOLIC PANEL - Abnormal; Notable for the following:    Potassium 3.4 (*)    Glucose, Bld 146 (*)    Calcium 8.8 (*)    All other components within normal limits  CBC  BRAIN NATRIURETIC PEPTIDE  I-STAT TROPOININ, ED    Imaging Review Dg Chest 2 View  10/02/2015  CLINICAL DATA:  Cough, fever since Monday. Left chest pain. Shortness of breath. EXAM: CHEST  2 VIEW COMPARISON:  01/02/2010 FINDINGS: Heart is normal size. Calcifications in the aortic arch. No confluent airspace opacities or effusions. No acute bony abnormality. IMPRESSION: No active cardiopulmonary disease. Aortic atherosclerosis. Electronically Signed   By: Rolm Baptise M.D.   On: 10/02/2015 11:59   I have personally reviewed and evaluated these images and lab results as part of my medical decision-making.  EKG Rate 93 Normal sinus rhythm with marked sinus arrhythmia Nonspecific ST-T wave changes No prior EKG for comparison  MDM   Final diagnoses:  Chronic obstructive pulmonary disease with acute exacerbation (HCC)    The patient was treated with  albuterol Atrovent as well as steroids. Her symptoms improved after treatment in the emergency room. She is feeling better and is ready to go home. I do not think she requires antibiotics. Chest x-ray is normal. Plan on discharge home with a steroid taper and outpatient follow-up.    Dorie Rank, MD 10/02/15 1435

## 2015-10-02 NOTE — ED Notes (Signed)
Pt denies concerns with dc 

## 2015-10-02 NOTE — ED Notes (Signed)
Pt reports history of emphysema with a cough and shortness since Monday when she used "round up" outside. Pt denies any chest pain but just feels short of breath while walking and coughing.

## 2015-10-04 DIAGNOSIS — F329 Major depressive disorder, single episode, unspecified: Secondary | ICD-10-CM | POA: Diagnosis not present

## 2015-10-04 DIAGNOSIS — J441 Chronic obstructive pulmonary disease with (acute) exacerbation: Secondary | ICD-10-CM | POA: Diagnosis not present

## 2015-10-23 DIAGNOSIS — J449 Chronic obstructive pulmonary disease, unspecified: Secondary | ICD-10-CM | POA: Diagnosis not present

## 2015-10-31 DIAGNOSIS — M150 Primary generalized (osteo)arthritis: Secondary | ICD-10-CM | POA: Diagnosis not present

## 2015-10-31 DIAGNOSIS — M797 Fibromyalgia: Secondary | ICD-10-CM | POA: Diagnosis not present

## 2015-10-31 DIAGNOSIS — J45909 Unspecified asthma, uncomplicated: Secondary | ICD-10-CM | POA: Diagnosis not present

## 2015-12-20 DIAGNOSIS — J449 Chronic obstructive pulmonary disease, unspecified: Secondary | ICD-10-CM | POA: Diagnosis not present

## 2016-01-03 DIAGNOSIS — D0439 Carcinoma in situ of skin of other parts of face: Secondary | ICD-10-CM | POA: Diagnosis not present

## 2016-01-03 DIAGNOSIS — L821 Other seborrheic keratosis: Secondary | ICD-10-CM | POA: Diagnosis not present

## 2016-01-03 DIAGNOSIS — L570 Actinic keratosis: Secondary | ICD-10-CM | POA: Diagnosis not present

## 2016-01-03 DIAGNOSIS — D485 Neoplasm of uncertain behavior of skin: Secondary | ICD-10-CM | POA: Diagnosis not present

## 2016-02-04 DIAGNOSIS — F418 Other specified anxiety disorders: Secondary | ICD-10-CM | POA: Diagnosis not present

## 2016-02-04 DIAGNOSIS — E7800 Pure hypercholesterolemia, unspecified: Secondary | ICD-10-CM | POA: Diagnosis not present

## 2016-02-04 DIAGNOSIS — E559 Vitamin D deficiency, unspecified: Secondary | ICD-10-CM | POA: Diagnosis not present

## 2016-02-04 DIAGNOSIS — M797 Fibromyalgia: Secondary | ICD-10-CM | POA: Diagnosis not present

## 2016-02-04 DIAGNOSIS — M150 Primary generalized (osteo)arthritis: Secondary | ICD-10-CM | POA: Diagnosis not present

## 2016-02-04 DIAGNOSIS — Z23 Encounter for immunization: Secondary | ICD-10-CM | POA: Diagnosis not present

## 2016-02-06 DIAGNOSIS — Z85828 Personal history of other malignant neoplasm of skin: Secondary | ICD-10-CM | POA: Diagnosis not present

## 2016-02-06 DIAGNOSIS — L570 Actinic keratosis: Secondary | ICD-10-CM | POA: Diagnosis not present

## 2016-02-12 DIAGNOSIS — J449 Chronic obstructive pulmonary disease, unspecified: Secondary | ICD-10-CM | POA: Diagnosis not present

## 2016-03-18 DIAGNOSIS — G894 Chronic pain syndrome: Secondary | ICD-10-CM | POA: Diagnosis not present

## 2016-03-18 DIAGNOSIS — M545 Low back pain: Secondary | ICD-10-CM | POA: Diagnosis not present

## 2016-03-18 DIAGNOSIS — R51 Headache: Secondary | ICD-10-CM | POA: Diagnosis not present

## 2016-03-18 DIAGNOSIS — Z79899 Other long term (current) drug therapy: Secondary | ICD-10-CM | POA: Diagnosis not present

## 2016-03-18 DIAGNOSIS — M542 Cervicalgia: Secondary | ICD-10-CM | POA: Diagnosis not present

## 2016-03-18 DIAGNOSIS — Z79891 Long term (current) use of opiate analgesic: Secondary | ICD-10-CM | POA: Diagnosis not present

## 2016-04-15 DIAGNOSIS — M542 Cervicalgia: Secondary | ICD-10-CM | POA: Diagnosis not present

## 2016-04-15 DIAGNOSIS — M545 Low back pain: Secondary | ICD-10-CM | POA: Diagnosis not present

## 2016-04-15 DIAGNOSIS — R51 Headache: Secondary | ICD-10-CM | POA: Diagnosis not present

## 2016-04-15 DIAGNOSIS — Z79891 Long term (current) use of opiate analgesic: Secondary | ICD-10-CM | POA: Diagnosis not present

## 2016-04-15 DIAGNOSIS — G894 Chronic pain syndrome: Secondary | ICD-10-CM | POA: Diagnosis not present

## 2016-04-15 DIAGNOSIS — Z79899 Other long term (current) drug therapy: Secondary | ICD-10-CM | POA: Diagnosis not present

## 2016-04-28 DIAGNOSIS — Z720 Tobacco use: Secondary | ICD-10-CM | POA: Diagnosis not present

## 2016-04-28 DIAGNOSIS — R05 Cough: Secondary | ICD-10-CM | POA: Diagnosis not present

## 2016-04-28 DIAGNOSIS — J441 Chronic obstructive pulmonary disease with (acute) exacerbation: Secondary | ICD-10-CM | POA: Diagnosis not present

## 2016-04-28 DIAGNOSIS — J45909 Unspecified asthma, uncomplicated: Secondary | ICD-10-CM | POA: Diagnosis not present

## 2016-05-07 DIAGNOSIS — J449 Chronic obstructive pulmonary disease, unspecified: Secondary | ICD-10-CM | POA: Diagnosis not present

## 2016-05-13 DIAGNOSIS — Z79891 Long term (current) use of opiate analgesic: Secondary | ICD-10-CM | POA: Diagnosis not present

## 2016-05-13 DIAGNOSIS — G894 Chronic pain syndrome: Secondary | ICD-10-CM | POA: Diagnosis not present

## 2016-05-13 DIAGNOSIS — M545 Low back pain: Secondary | ICD-10-CM | POA: Diagnosis not present

## 2016-05-13 DIAGNOSIS — Z79899 Other long term (current) drug therapy: Secondary | ICD-10-CM | POA: Diagnosis not present

## 2016-05-13 DIAGNOSIS — M542 Cervicalgia: Secondary | ICD-10-CM | POA: Diagnosis not present

## 2016-05-18 DIAGNOSIS — M545 Low back pain: Secondary | ICD-10-CM | POA: Diagnosis not present

## 2016-05-18 DIAGNOSIS — M542 Cervicalgia: Secondary | ICD-10-CM | POA: Diagnosis not present

## 2016-05-18 DIAGNOSIS — R51 Headache: Secondary | ICD-10-CM | POA: Diagnosis not present

## 2016-05-18 DIAGNOSIS — G894 Chronic pain syndrome: Secondary | ICD-10-CM | POA: Diagnosis not present

## 2016-05-21 DIAGNOSIS — M542 Cervicalgia: Secondary | ICD-10-CM | POA: Diagnosis not present

## 2016-05-21 DIAGNOSIS — M25511 Pain in right shoulder: Secondary | ICD-10-CM | POA: Diagnosis not present

## 2016-05-21 DIAGNOSIS — R29898 Other symptoms and signs involving the musculoskeletal system: Secondary | ICD-10-CM | POA: Diagnosis not present

## 2016-06-10 DIAGNOSIS — G894 Chronic pain syndrome: Secondary | ICD-10-CM | POA: Diagnosis not present

## 2016-06-10 DIAGNOSIS — Z79891 Long term (current) use of opiate analgesic: Secondary | ICD-10-CM | POA: Diagnosis not present

## 2016-06-10 DIAGNOSIS — M542 Cervicalgia: Secondary | ICD-10-CM | POA: Diagnosis not present

## 2016-06-10 DIAGNOSIS — M545 Low back pain: Secondary | ICD-10-CM | POA: Diagnosis not present

## 2016-06-10 DIAGNOSIS — Z79899 Other long term (current) drug therapy: Secondary | ICD-10-CM | POA: Diagnosis not present

## 2016-06-23 DIAGNOSIS — Z0000 Encounter for general adult medical examination without abnormal findings: Secondary | ICD-10-CM | POA: Diagnosis not present

## 2016-06-24 DIAGNOSIS — R739 Hyperglycemia, unspecified: Secondary | ICD-10-CM | POA: Diagnosis not present

## 2016-06-30 DIAGNOSIS — M5412 Radiculopathy, cervical region: Secondary | ICD-10-CM | POA: Diagnosis not present

## 2016-06-30 DIAGNOSIS — G5601 Carpal tunnel syndrome, right upper limb: Secondary | ICD-10-CM | POA: Diagnosis not present

## 2016-07-08 DIAGNOSIS — Z79891 Long term (current) use of opiate analgesic: Secondary | ICD-10-CM | POA: Diagnosis not present

## 2016-07-08 DIAGNOSIS — Z79899 Other long term (current) drug therapy: Secondary | ICD-10-CM | POA: Diagnosis not present

## 2016-07-08 DIAGNOSIS — G894 Chronic pain syndrome: Secondary | ICD-10-CM | POA: Diagnosis not present

## 2016-07-08 DIAGNOSIS — M47812 Spondylosis without myelopathy or radiculopathy, cervical region: Secondary | ICD-10-CM | POA: Diagnosis not present

## 2016-07-08 DIAGNOSIS — M542 Cervicalgia: Secondary | ICD-10-CM | POA: Diagnosis not present

## 2016-07-08 DIAGNOSIS — M545 Low back pain: Secondary | ICD-10-CM | POA: Diagnosis not present

## 2016-07-14 DIAGNOSIS — E559 Vitamin D deficiency, unspecified: Secondary | ICD-10-CM | POA: Diagnosis not present

## 2016-07-14 DIAGNOSIS — Z0000 Encounter for general adult medical examination without abnormal findings: Secondary | ICD-10-CM | POA: Diagnosis not present

## 2016-07-14 DIAGNOSIS — E7800 Pure hypercholesterolemia, unspecified: Secondary | ICD-10-CM | POA: Diagnosis not present

## 2016-07-14 DIAGNOSIS — M810 Age-related osteoporosis without current pathological fracture: Secondary | ICD-10-CM | POA: Diagnosis not present

## 2016-07-14 DIAGNOSIS — J449 Chronic obstructive pulmonary disease, unspecified: Secondary | ICD-10-CM | POA: Diagnosis not present

## 2016-07-16 DIAGNOSIS — G5601 Carpal tunnel syndrome, right upper limb: Secondary | ICD-10-CM | POA: Diagnosis not present

## 2016-07-27 DIAGNOSIS — M810 Age-related osteoporosis without current pathological fracture: Secondary | ICD-10-CM | POA: Diagnosis not present

## 2016-08-19 DIAGNOSIS — M542 Cervicalgia: Secondary | ICD-10-CM | POA: Diagnosis not present

## 2016-08-19 DIAGNOSIS — M47812 Spondylosis without myelopathy or radiculopathy, cervical region: Secondary | ICD-10-CM | POA: Diagnosis not present

## 2016-08-19 DIAGNOSIS — G5601 Carpal tunnel syndrome, right upper limb: Secondary | ICD-10-CM | POA: Diagnosis not present

## 2016-08-19 DIAGNOSIS — G894 Chronic pain syndrome: Secondary | ICD-10-CM | POA: Diagnosis not present

## 2016-09-16 DIAGNOSIS — M545 Low back pain: Secondary | ICD-10-CM | POA: Diagnosis not present

## 2016-09-16 DIAGNOSIS — Z79899 Other long term (current) drug therapy: Secondary | ICD-10-CM | POA: Diagnosis not present

## 2016-09-16 DIAGNOSIS — M542 Cervicalgia: Secondary | ICD-10-CM | POA: Diagnosis not present

## 2016-09-16 DIAGNOSIS — G894 Chronic pain syndrome: Secondary | ICD-10-CM | POA: Diagnosis not present

## 2016-09-16 DIAGNOSIS — Z79891 Long term (current) use of opiate analgesic: Secondary | ICD-10-CM | POA: Diagnosis not present

## 2016-10-14 DIAGNOSIS — M542 Cervicalgia: Secondary | ICD-10-CM | POA: Diagnosis not present

## 2016-10-14 DIAGNOSIS — M545 Low back pain: Secondary | ICD-10-CM | POA: Diagnosis not present

## 2016-10-14 DIAGNOSIS — G894 Chronic pain syndrome: Secondary | ICD-10-CM | POA: Diagnosis not present

## 2016-10-14 DIAGNOSIS — Z79891 Long term (current) use of opiate analgesic: Secondary | ICD-10-CM | POA: Diagnosis not present

## 2016-10-14 DIAGNOSIS — Z79899 Other long term (current) drug therapy: Secondary | ICD-10-CM | POA: Diagnosis not present

## 2016-10-29 DIAGNOSIS — J449 Chronic obstructive pulmonary disease, unspecified: Secondary | ICD-10-CM | POA: Diagnosis not present

## 2016-11-09 DIAGNOSIS — M542 Cervicalgia: Secondary | ICD-10-CM | POA: Diagnosis not present

## 2016-11-09 DIAGNOSIS — G894 Chronic pain syndrome: Secondary | ICD-10-CM | POA: Diagnosis not present

## 2016-11-09 DIAGNOSIS — Z79891 Long term (current) use of opiate analgesic: Secondary | ICD-10-CM | POA: Diagnosis not present

## 2016-11-09 DIAGNOSIS — M545 Low back pain: Secondary | ICD-10-CM | POA: Diagnosis not present

## 2016-11-09 DIAGNOSIS — Z79899 Other long term (current) drug therapy: Secondary | ICD-10-CM | POA: Diagnosis not present

## 2016-12-03 DIAGNOSIS — Z79899 Other long term (current) drug therapy: Secondary | ICD-10-CM | POA: Diagnosis not present

## 2016-12-03 DIAGNOSIS — M542 Cervicalgia: Secondary | ICD-10-CM | POA: Diagnosis not present

## 2016-12-03 DIAGNOSIS — M545 Low back pain: Secondary | ICD-10-CM | POA: Diagnosis not present

## 2016-12-03 DIAGNOSIS — Z79891 Long term (current) use of opiate analgesic: Secondary | ICD-10-CM | POA: Diagnosis not present

## 2016-12-03 DIAGNOSIS — G894 Chronic pain syndrome: Secondary | ICD-10-CM | POA: Diagnosis not present

## 2016-12-03 DIAGNOSIS — G5601 Carpal tunnel syndrome, right upper limb: Secondary | ICD-10-CM | POA: Diagnosis not present

## 2016-12-31 DIAGNOSIS — G894 Chronic pain syndrome: Secondary | ICD-10-CM | POA: Diagnosis not present

## 2016-12-31 DIAGNOSIS — M545 Low back pain: Secondary | ICD-10-CM | POA: Diagnosis not present

## 2016-12-31 DIAGNOSIS — Z79899 Other long term (current) drug therapy: Secondary | ICD-10-CM | POA: Diagnosis not present

## 2016-12-31 DIAGNOSIS — M542 Cervicalgia: Secondary | ICD-10-CM | POA: Diagnosis not present

## 2016-12-31 DIAGNOSIS — Z79891 Long term (current) use of opiate analgesic: Secondary | ICD-10-CM | POA: Diagnosis not present

## 2017-01-11 DIAGNOSIS — J449 Chronic obstructive pulmonary disease, unspecified: Secondary | ICD-10-CM | POA: Diagnosis not present

## 2017-01-12 DIAGNOSIS — E7800 Pure hypercholesterolemia, unspecified: Secondary | ICD-10-CM | POA: Diagnosis not present

## 2017-01-19 DIAGNOSIS — E559 Vitamin D deficiency, unspecified: Secondary | ICD-10-CM | POA: Diagnosis not present

## 2017-01-19 DIAGNOSIS — M810 Age-related osteoporosis without current pathological fracture: Secondary | ICD-10-CM | POA: Diagnosis not present

## 2017-01-19 DIAGNOSIS — E7800 Pure hypercholesterolemia, unspecified: Secondary | ICD-10-CM | POA: Diagnosis not present

## 2017-01-19 DIAGNOSIS — J449 Chronic obstructive pulmonary disease, unspecified: Secondary | ICD-10-CM | POA: Diagnosis not present

## 2017-01-28 DIAGNOSIS — M545 Low back pain: Secondary | ICD-10-CM | POA: Diagnosis not present

## 2017-01-28 DIAGNOSIS — M542 Cervicalgia: Secondary | ICD-10-CM | POA: Diagnosis not present

## 2017-01-28 DIAGNOSIS — G894 Chronic pain syndrome: Secondary | ICD-10-CM | POA: Diagnosis not present

## 2017-01-28 DIAGNOSIS — M47812 Spondylosis without myelopathy or radiculopathy, cervical region: Secondary | ICD-10-CM | POA: Diagnosis not present

## 2017-02-19 DIAGNOSIS — F329 Major depressive disorder, single episode, unspecified: Secondary | ICD-10-CM | POA: Diagnosis not present

## 2017-02-19 DIAGNOSIS — J449 Chronic obstructive pulmonary disease, unspecified: Secondary | ICD-10-CM | POA: Diagnosis not present

## 2017-02-19 DIAGNOSIS — E7800 Pure hypercholesterolemia, unspecified: Secondary | ICD-10-CM | POA: Diagnosis not present

## 2017-02-19 DIAGNOSIS — M810 Age-related osteoporosis without current pathological fracture: Secondary | ICD-10-CM | POA: Diagnosis not present

## 2017-03-01 DIAGNOSIS — M542 Cervicalgia: Secondary | ICD-10-CM | POA: Diagnosis not present

## 2017-03-01 DIAGNOSIS — M47812 Spondylosis without myelopathy or radiculopathy, cervical region: Secondary | ICD-10-CM | POA: Diagnosis not present

## 2017-03-01 DIAGNOSIS — Z79891 Long term (current) use of opiate analgesic: Secondary | ICD-10-CM | POA: Diagnosis not present

## 2017-03-01 DIAGNOSIS — G894 Chronic pain syndrome: Secondary | ICD-10-CM | POA: Diagnosis not present

## 2017-03-01 DIAGNOSIS — M545 Low back pain: Secondary | ICD-10-CM | POA: Diagnosis not present

## 2017-03-01 DIAGNOSIS — Z79899 Other long term (current) drug therapy: Secondary | ICD-10-CM | POA: Diagnosis not present

## 2017-03-24 DIAGNOSIS — F329 Major depressive disorder, single episode, unspecified: Secondary | ICD-10-CM | POA: Diagnosis not present

## 2017-03-24 DIAGNOSIS — J449 Chronic obstructive pulmonary disease, unspecified: Secondary | ICD-10-CM | POA: Diagnosis not present

## 2017-03-24 DIAGNOSIS — E7800 Pure hypercholesterolemia, unspecified: Secondary | ICD-10-CM | POA: Diagnosis not present

## 2017-03-24 DIAGNOSIS — M810 Age-related osteoporosis without current pathological fracture: Secondary | ICD-10-CM | POA: Diagnosis not present

## 2017-03-26 DIAGNOSIS — Z720 Tobacco use: Secondary | ICD-10-CM | POA: Diagnosis not present

## 2017-03-26 DIAGNOSIS — J449 Chronic obstructive pulmonary disease, unspecified: Secondary | ICD-10-CM | POA: Diagnosis not present

## 2017-03-31 DIAGNOSIS — M47812 Spondylosis without myelopathy or radiculopathy, cervical region: Secondary | ICD-10-CM | POA: Diagnosis not present

## 2017-03-31 DIAGNOSIS — G894 Chronic pain syndrome: Secondary | ICD-10-CM | POA: Diagnosis not present

## 2017-03-31 DIAGNOSIS — Z79899 Other long term (current) drug therapy: Secondary | ICD-10-CM | POA: Diagnosis not present

## 2017-03-31 DIAGNOSIS — Z79891 Long term (current) use of opiate analgesic: Secondary | ICD-10-CM | POA: Diagnosis not present

## 2017-04-15 DIAGNOSIS — F329 Major depressive disorder, single episode, unspecified: Secondary | ICD-10-CM | POA: Diagnosis not present

## 2017-04-15 DIAGNOSIS — M810 Age-related osteoporosis without current pathological fracture: Secondary | ICD-10-CM | POA: Diagnosis not present

## 2017-04-15 DIAGNOSIS — E7800 Pure hypercholesterolemia, unspecified: Secondary | ICD-10-CM | POA: Diagnosis not present

## 2017-04-15 DIAGNOSIS — J449 Chronic obstructive pulmonary disease, unspecified: Secondary | ICD-10-CM | POA: Diagnosis not present

## 2017-04-21 DIAGNOSIS — F329 Major depressive disorder, single episode, unspecified: Secondary | ICD-10-CM | POA: Diagnosis not present

## 2017-04-28 DIAGNOSIS — M47812 Spondylosis without myelopathy or radiculopathy, cervical region: Secondary | ICD-10-CM | POA: Diagnosis not present

## 2017-04-28 DIAGNOSIS — M542 Cervicalgia: Secondary | ICD-10-CM | POA: Diagnosis not present

## 2017-04-28 DIAGNOSIS — G894 Chronic pain syndrome: Secondary | ICD-10-CM | POA: Diagnosis not present

## 2017-04-28 DIAGNOSIS — M545 Low back pain: Secondary | ICD-10-CM | POA: Diagnosis not present

## 2017-05-14 DIAGNOSIS — J45909 Unspecified asthma, uncomplicated: Secondary | ICD-10-CM | POA: Diagnosis not present

## 2017-05-18 DIAGNOSIS — J449 Chronic obstructive pulmonary disease, unspecified: Secondary | ICD-10-CM | POA: Diagnosis not present

## 2017-05-19 ENCOUNTER — Emergency Department (HOSPITAL_COMMUNITY)
Admission: EM | Admit: 2017-05-19 | Discharge: 2017-05-20 | Disposition: A | Discharge status: Home / Self Care | Payer: Medicare Other | Attending: Emergency Medicine | Admitting: Emergency Medicine

## 2017-05-19 ENCOUNTER — Other Ambulatory Visit: Payer: Self-pay

## 2017-05-19 ENCOUNTER — Encounter (HOSPITAL_COMMUNITY): Payer: Self-pay | Admitting: Emergency Medicine

## 2017-05-19 ENCOUNTER — Emergency Department (HOSPITAL_COMMUNITY): Payer: Medicare Other

## 2017-05-19 DIAGNOSIS — Z5321 Procedure and treatment not carried out due to patient leaving prior to being seen by health care provider: Secondary | ICD-10-CM | POA: Diagnosis not present

## 2017-05-19 DIAGNOSIS — R0989 Other specified symptoms and signs involving the circulatory and respiratory systems: Secondary | ICD-10-CM | POA: Diagnosis not present

## 2017-05-19 DIAGNOSIS — R0602 Shortness of breath: Secondary | ICD-10-CM | POA: Diagnosis not present

## 2017-05-19 DIAGNOSIS — R0600 Dyspnea, unspecified: Secondary | ICD-10-CM | POA: Diagnosis not present

## 2017-05-19 HISTORY — DX: Chronic obstructive pulmonary disease, unspecified: J44.9

## 2017-05-19 LAB — CBC
HEMATOCRIT: 46.5 % — AB (ref 36.0–46.0)
Hemoglobin: 14.9 g/dL (ref 12.0–15.0)
MCH: 28.4 pg (ref 26.0–34.0)
MCHC: 32 g/dL (ref 30.0–36.0)
MCV: 88.6 fL (ref 78.0–100.0)
Platelets: 225 10*3/uL (ref 150–400)
RBC: 5.25 MIL/uL — ABNORMAL HIGH (ref 3.87–5.11)
RDW: 14.1 % (ref 11.5–15.5)
WBC: 5.5 10*3/uL (ref 4.0–10.5)

## 2017-05-19 LAB — BASIC METABOLIC PANEL
Anion gap: 10 (ref 5–15)
BUN: 12 mg/dL (ref 6–20)
CHLORIDE: 103 mmol/L (ref 101–111)
CO2: 27 mmol/L (ref 22–32)
CREATININE: 0.72 mg/dL (ref 0.44–1.00)
Calcium: 8.8 mg/dL — ABNORMAL LOW (ref 8.9–10.3)
GFR calc Af Amer: >60 mL/min (ref >60)
GFR calc non Af Amer: >60 mL/min (ref >60)
GLUCOSE: 128 mg/dL — AB (ref 65–99)
POTASSIUM: 4.4 mmol/L (ref 3.5–5.1)
Sodium: 140 mmol/L (ref 135–145)

## 2017-05-19 LAB — I-STAT TROPONIN, ED: Troponin i, poc: 0 ng/mL (ref 0.00–0.08)

## 2017-05-19 NOTE — ED Triage Notes (Signed)
Pt presents with flu-like symptoms and SOB since thur; has seen her PCP and given z-pak and steriods with no improvement

## 2017-05-21 NOTE — ED Notes (Signed)
05/21/2017,  

## 2017-05-21 NOTE — ED Notes (Signed)
05/21/2017, Follow - up call completed.  Pt. Very grateful for the follow-up.

## 2017-05-27 DIAGNOSIS — J45909 Unspecified asthma, uncomplicated: Secondary | ICD-10-CM | POA: Diagnosis not present

## 2017-05-27 DIAGNOSIS — F419 Anxiety disorder, unspecified: Secondary | ICD-10-CM | POA: Diagnosis not present

## 2017-05-27 DIAGNOSIS — M797 Fibromyalgia: Secondary | ICD-10-CM | POA: Diagnosis not present

## 2017-05-27 DIAGNOSIS — F329 Major depressive disorder, single episode, unspecified: Secondary | ICD-10-CM | POA: Diagnosis not present

## 2017-05-31 DIAGNOSIS — M797 Fibromyalgia: Secondary | ICD-10-CM | POA: Diagnosis not present

## 2017-05-31 DIAGNOSIS — Z79891 Long term (current) use of opiate analgesic: Secondary | ICD-10-CM | POA: Diagnosis not present

## 2017-05-31 DIAGNOSIS — Z79899 Other long term (current) drug therapy: Secondary | ICD-10-CM | POA: Diagnosis not present

## 2017-05-31 DIAGNOSIS — G894 Chronic pain syndrome: Secondary | ICD-10-CM | POA: Diagnosis not present

## 2017-05-31 DIAGNOSIS — M542 Cervicalgia: Secondary | ICD-10-CM | POA: Diagnosis not present

## 2017-05-31 DIAGNOSIS — M47812 Spondylosis without myelopathy or radiculopathy, cervical region: Secondary | ICD-10-CM | POA: Diagnosis not present

## 2017-06-15 DIAGNOSIS — M797 Fibromyalgia: Secondary | ICD-10-CM | POA: Diagnosis not present

## 2017-06-15 DIAGNOSIS — Z79891 Long term (current) use of opiate analgesic: Secondary | ICD-10-CM | POA: Diagnosis not present

## 2017-06-15 DIAGNOSIS — M47812 Spondylosis without myelopathy or radiculopathy, cervical region: Secondary | ICD-10-CM | POA: Diagnosis not present

## 2017-06-15 DIAGNOSIS — M545 Low back pain: Secondary | ICD-10-CM | POA: Diagnosis not present

## 2017-06-15 DIAGNOSIS — Z79899 Other long term (current) drug therapy: Secondary | ICD-10-CM | POA: Diagnosis not present

## 2017-06-15 DIAGNOSIS — G894 Chronic pain syndrome: Secondary | ICD-10-CM | POA: Diagnosis not present

## 2017-06-29 DIAGNOSIS — M810 Age-related osteoporosis without current pathological fracture: Secondary | ICD-10-CM | POA: Diagnosis not present

## 2017-06-29 DIAGNOSIS — F329 Major depressive disorder, single episode, unspecified: Secondary | ICD-10-CM | POA: Diagnosis not present

## 2017-06-29 DIAGNOSIS — J449 Chronic obstructive pulmonary disease, unspecified: Secondary | ICD-10-CM | POA: Diagnosis not present

## 2017-06-29 DIAGNOSIS — E7800 Pure hypercholesterolemia, unspecified: Secondary | ICD-10-CM | POA: Diagnosis not present

## 2017-06-30 DIAGNOSIS — G894 Chronic pain syndrome: Secondary | ICD-10-CM | POA: Diagnosis not present

## 2017-06-30 DIAGNOSIS — M47812 Spondylosis without myelopathy or radiculopathy, cervical region: Secondary | ICD-10-CM | POA: Diagnosis not present

## 2017-06-30 DIAGNOSIS — M797 Fibromyalgia: Secondary | ICD-10-CM | POA: Diagnosis not present

## 2017-06-30 DIAGNOSIS — M545 Low back pain: Secondary | ICD-10-CM | POA: Diagnosis not present

## 2017-07-13 DIAGNOSIS — E7800 Pure hypercholesterolemia, unspecified: Secondary | ICD-10-CM | POA: Diagnosis not present

## 2017-07-13 DIAGNOSIS — R7303 Prediabetes: Secondary | ICD-10-CM | POA: Diagnosis not present

## 2017-07-13 DIAGNOSIS — M797 Fibromyalgia: Secondary | ICD-10-CM | POA: Diagnosis not present

## 2017-07-20 DIAGNOSIS — R7301 Impaired fasting glucose: Secondary | ICD-10-CM | POA: Diagnosis not present

## 2017-07-20 DIAGNOSIS — Z0000 Encounter for general adult medical examination without abnormal findings: Secondary | ICD-10-CM | POA: Diagnosis not present

## 2017-07-20 DIAGNOSIS — M797 Fibromyalgia: Secondary | ICD-10-CM | POA: Diagnosis not present

## 2017-07-20 DIAGNOSIS — E559 Vitamin D deficiency, unspecified: Secondary | ICD-10-CM | POA: Diagnosis not present

## 2017-07-22 ENCOUNTER — Other Ambulatory Visit: Payer: Self-pay | Admitting: Internal Medicine

## 2017-07-22 DIAGNOSIS — Z720 Tobacco use: Secondary | ICD-10-CM

## 2017-07-28 DIAGNOSIS — M542 Cervicalgia: Secondary | ICD-10-CM | POA: Diagnosis not present

## 2017-07-28 DIAGNOSIS — G894 Chronic pain syndrome: Secondary | ICD-10-CM | POA: Diagnosis not present

## 2017-07-28 DIAGNOSIS — M47812 Spondylosis without myelopathy or radiculopathy, cervical region: Secondary | ICD-10-CM | POA: Diagnosis not present

## 2017-07-28 DIAGNOSIS — M797 Fibromyalgia: Secondary | ICD-10-CM | POA: Diagnosis not present

## 2017-07-29 ENCOUNTER — Ambulatory Visit
Admission: RE | Admit: 2017-07-29 | Discharge: 2017-07-29 | Disposition: A | Discharge status: Home / Self Care | Payer: Medicare Other | Source: Ambulatory Visit | Attending: Internal Medicine | Admitting: Internal Medicine

## 2017-07-29 DIAGNOSIS — Z720 Tobacco use: Secondary | ICD-10-CM

## 2017-07-29 DIAGNOSIS — F17210 Nicotine dependence, cigarettes, uncomplicated: Secondary | ICD-10-CM | POA: Diagnosis not present

## 2017-08-23 DIAGNOSIS — J449 Chronic obstructive pulmonary disease, unspecified: Secondary | ICD-10-CM | POA: Diagnosis not present

## 2017-08-25 DIAGNOSIS — Z79899 Other long term (current) drug therapy: Secondary | ICD-10-CM | POA: Diagnosis not present

## 2017-08-25 DIAGNOSIS — Z79891 Long term (current) use of opiate analgesic: Secondary | ICD-10-CM | POA: Diagnosis not present

## 2017-08-25 DIAGNOSIS — M47812 Spondylosis without myelopathy or radiculopathy, cervical region: Secondary | ICD-10-CM | POA: Diagnosis not present

## 2017-08-25 DIAGNOSIS — M797 Fibromyalgia: Secondary | ICD-10-CM | POA: Diagnosis not present

## 2017-08-25 DIAGNOSIS — M545 Low back pain: Secondary | ICD-10-CM | POA: Diagnosis not present

## 2017-08-25 DIAGNOSIS — G894 Chronic pain syndrome: Secondary | ICD-10-CM | POA: Diagnosis not present

## 2017-09-16 DIAGNOSIS — J449 Chronic obstructive pulmonary disease, unspecified: Secondary | ICD-10-CM | POA: Diagnosis not present

## 2017-09-21 DIAGNOSIS — G894 Chronic pain syndrome: Secondary | ICD-10-CM | POA: Diagnosis not present

## 2017-09-21 DIAGNOSIS — M797 Fibromyalgia: Secondary | ICD-10-CM | POA: Diagnosis not present

## 2017-09-21 DIAGNOSIS — M545 Low back pain: Secondary | ICD-10-CM | POA: Diagnosis not present

## 2017-09-21 DIAGNOSIS — M47812 Spondylosis without myelopathy or radiculopathy, cervical region: Secondary | ICD-10-CM | POA: Diagnosis not present

## 2017-10-14 DIAGNOSIS — J449 Chronic obstructive pulmonary disease, unspecified: Secondary | ICD-10-CM | POA: Diagnosis not present

## 2017-10-19 DIAGNOSIS — Z79891 Long term (current) use of opiate analgesic: Secondary | ICD-10-CM | POA: Diagnosis not present

## 2017-10-19 DIAGNOSIS — G894 Chronic pain syndrome: Secondary | ICD-10-CM | POA: Diagnosis not present

## 2017-10-19 DIAGNOSIS — Z79899 Other long term (current) drug therapy: Secondary | ICD-10-CM | POA: Diagnosis not present

## 2017-10-19 DIAGNOSIS — M542 Cervicalgia: Secondary | ICD-10-CM | POA: Diagnosis not present

## 2017-10-19 DIAGNOSIS — M47812 Spondylosis without myelopathy or radiculopathy, cervical region: Secondary | ICD-10-CM | POA: Diagnosis not present

## 2017-11-02 DIAGNOSIS — F4542 Pain disorder with related psychological factors: Secondary | ICD-10-CM | POA: Diagnosis not present

## 2017-11-02 DIAGNOSIS — M792 Neuralgia and neuritis, unspecified: Secondary | ICD-10-CM | POA: Diagnosis not present

## 2017-11-02 DIAGNOSIS — G894 Chronic pain syndrome: Secondary | ICD-10-CM | POA: Diagnosis not present

## 2017-11-02 DIAGNOSIS — G609 Hereditary and idiopathic neuropathy, unspecified: Secondary | ICD-10-CM | POA: Diagnosis not present

## 2017-11-11 DIAGNOSIS — B3741 Candidal cystitis and urethritis: Secondary | ICD-10-CM | POA: Diagnosis not present

## 2017-11-11 DIAGNOSIS — Z01419 Encounter for gynecological examination (general) (routine) without abnormal findings: Secondary | ICD-10-CM | POA: Diagnosis not present

## 2017-11-11 DIAGNOSIS — N390 Urinary tract infection, site not specified: Secondary | ICD-10-CM | POA: Diagnosis not present

## 2017-11-16 DIAGNOSIS — M542 Cervicalgia: Secondary | ICD-10-CM | POA: Diagnosis not present

## 2017-11-16 DIAGNOSIS — M797 Fibromyalgia: Secondary | ICD-10-CM | POA: Diagnosis not present

## 2017-11-16 DIAGNOSIS — G894 Chronic pain syndrome: Secondary | ICD-10-CM | POA: Diagnosis not present

## 2017-11-16 DIAGNOSIS — M47812 Spondylosis without myelopathy or radiculopathy, cervical region: Secondary | ICD-10-CM | POA: Diagnosis not present

## 2017-11-24 DIAGNOSIS — M47812 Spondylosis without myelopathy or radiculopathy, cervical region: Secondary | ICD-10-CM | POA: Diagnosis not present

## 2017-12-14 DIAGNOSIS — G894 Chronic pain syndrome: Secondary | ICD-10-CM | POA: Diagnosis not present

## 2017-12-14 DIAGNOSIS — M542 Cervicalgia: Secondary | ICD-10-CM | POA: Diagnosis not present

## 2017-12-14 DIAGNOSIS — M797 Fibromyalgia: Secondary | ICD-10-CM | POA: Diagnosis not present

## 2017-12-14 DIAGNOSIS — Z79891 Long term (current) use of opiate analgesic: Secondary | ICD-10-CM | POA: Diagnosis not present

## 2017-12-14 DIAGNOSIS — Z79899 Other long term (current) drug therapy: Secondary | ICD-10-CM | POA: Diagnosis not present

## 2017-12-14 DIAGNOSIS — M47812 Spondylosis without myelopathy or radiculopathy, cervical region: Secondary | ICD-10-CM | POA: Diagnosis not present

## 2017-12-28 DIAGNOSIS — Z23 Encounter for immunization: Secondary | ICD-10-CM | POA: Diagnosis not present

## 2017-12-28 DIAGNOSIS — B0232 Zoster iridocyclitis: Secondary | ICD-10-CM | POA: Diagnosis not present

## 2017-12-28 DIAGNOSIS — B0239 Other herpes zoster eye disease: Secondary | ICD-10-CM | POA: Diagnosis not present

## 2017-12-28 DIAGNOSIS — B0230 Zoster ocular disease, unspecified: Secondary | ICD-10-CM | POA: Diagnosis not present

## 2017-12-30 DIAGNOSIS — B0230 Zoster ocular disease, unspecified: Secondary | ICD-10-CM | POA: Diagnosis not present

## 2017-12-30 DIAGNOSIS — B0232 Zoster iridocyclitis: Secondary | ICD-10-CM | POA: Diagnosis not present

## 2017-12-30 DIAGNOSIS — B0239 Other herpes zoster eye disease: Secondary | ICD-10-CM | POA: Diagnosis not present

## 2017-12-30 DIAGNOSIS — B0229 Other postherpetic nervous system involvement: Secondary | ICD-10-CM | POA: Diagnosis not present

## 2018-01-05 DIAGNOSIS — J449 Chronic obstructive pulmonary disease, unspecified: Secondary | ICD-10-CM | POA: Diagnosis not present

## 2018-01-06 DIAGNOSIS — B0230 Zoster ocular disease, unspecified: Secondary | ICD-10-CM | POA: Diagnosis not present

## 2018-01-06 DIAGNOSIS — B0232 Zoster iridocyclitis: Secondary | ICD-10-CM | POA: Diagnosis not present

## 2018-01-06 DIAGNOSIS — B0239 Other herpes zoster eye disease: Secondary | ICD-10-CM | POA: Diagnosis not present

## 2018-01-06 DIAGNOSIS — B0229 Other postherpetic nervous system involvement: Secondary | ICD-10-CM | POA: Diagnosis not present

## 2018-01-11 DIAGNOSIS — G894 Chronic pain syndrome: Secondary | ICD-10-CM | POA: Diagnosis not present

## 2018-01-11 DIAGNOSIS — M47812 Spondylosis without myelopathy or radiculopathy, cervical region: Secondary | ICD-10-CM | POA: Diagnosis not present

## 2018-01-11 DIAGNOSIS — M542 Cervicalgia: Secondary | ICD-10-CM | POA: Diagnosis not present

## 2018-01-11 DIAGNOSIS — M545 Low back pain: Secondary | ICD-10-CM | POA: Diagnosis not present

## 2018-01-14 DIAGNOSIS — E7800 Pure hypercholesterolemia, unspecified: Secondary | ICD-10-CM | POA: Diagnosis not present

## 2018-01-17 DIAGNOSIS — B0230 Zoster ocular disease, unspecified: Secondary | ICD-10-CM | POA: Diagnosis not present

## 2018-01-17 DIAGNOSIS — B0229 Other postherpetic nervous system involvement: Secondary | ICD-10-CM | POA: Diagnosis not present

## 2018-01-19 DIAGNOSIS — Z720 Tobacco use: Secondary | ICD-10-CM | POA: Diagnosis not present

## 2018-01-19 DIAGNOSIS — J449 Chronic obstructive pulmonary disease, unspecified: Secondary | ICD-10-CM | POA: Diagnosis not present

## 2018-01-19 DIAGNOSIS — F329 Major depressive disorder, single episode, unspecified: Secondary | ICD-10-CM | POA: Diagnosis not present

## 2018-01-19 DIAGNOSIS — E7800 Pure hypercholesterolemia, unspecified: Secondary | ICD-10-CM | POA: Diagnosis not present

## 2018-02-09 DIAGNOSIS — M47812 Spondylosis without myelopathy or radiculopathy, cervical region: Secondary | ICD-10-CM | POA: Diagnosis not present

## 2018-02-09 DIAGNOSIS — M797 Fibromyalgia: Secondary | ICD-10-CM | POA: Diagnosis not present

## 2018-02-09 DIAGNOSIS — M545 Low back pain: Secondary | ICD-10-CM | POA: Diagnosis not present

## 2018-02-09 DIAGNOSIS — G894 Chronic pain syndrome: Secondary | ICD-10-CM | POA: Diagnosis not present

## 2018-02-23 DIAGNOSIS — J449 Chronic obstructive pulmonary disease, unspecified: Secondary | ICD-10-CM | POA: Diagnosis not present

## 2018-03-10 DIAGNOSIS — G894 Chronic pain syndrome: Secondary | ICD-10-CM | POA: Diagnosis not present

## 2018-03-10 DIAGNOSIS — Z79899 Other long term (current) drug therapy: Secondary | ICD-10-CM | POA: Diagnosis not present

## 2018-03-10 DIAGNOSIS — M542 Cervicalgia: Secondary | ICD-10-CM | POA: Diagnosis not present

## 2018-03-10 DIAGNOSIS — Z79891 Long term (current) use of opiate analgesic: Secondary | ICD-10-CM | POA: Diagnosis not present

## 2018-03-10 DIAGNOSIS — M797 Fibromyalgia: Secondary | ICD-10-CM | POA: Diagnosis not present

## 2018-03-10 DIAGNOSIS — M47812 Spondylosis without myelopathy or radiculopathy, cervical region: Secondary | ICD-10-CM | POA: Diagnosis not present

## 2018-03-11 DIAGNOSIS — H25012 Cortical age-related cataract, left eye: Secondary | ICD-10-CM | POA: Diagnosis not present

## 2018-03-11 DIAGNOSIS — H26491 Other secondary cataract, right eye: Secondary | ICD-10-CM | POA: Diagnosis not present

## 2018-03-11 DIAGNOSIS — Z961 Presence of intraocular lens: Secondary | ICD-10-CM | POA: Diagnosis not present

## 2018-03-11 DIAGNOSIS — H2512 Age-related nuclear cataract, left eye: Secondary | ICD-10-CM | POA: Diagnosis not present

## 2018-04-07 DIAGNOSIS — M47812 Spondylosis without myelopathy or radiculopathy, cervical region: Secondary | ICD-10-CM | POA: Diagnosis not present

## 2018-04-07 DIAGNOSIS — G894 Chronic pain syndrome: Secondary | ICD-10-CM | POA: Diagnosis not present

## 2018-04-07 DIAGNOSIS — M545 Low back pain: Secondary | ICD-10-CM | POA: Diagnosis not present

## 2018-04-07 DIAGNOSIS — M542 Cervicalgia: Secondary | ICD-10-CM | POA: Diagnosis not present

## 2018-04-11 DIAGNOSIS — C44622 Squamous cell carcinoma of skin of right upper limb, including shoulder: Secondary | ICD-10-CM | POA: Diagnosis not present

## 2018-04-11 DIAGNOSIS — C44629 Squamous cell carcinoma of skin of left upper limb, including shoulder: Secondary | ICD-10-CM | POA: Diagnosis not present

## 2018-04-11 DIAGNOSIS — D485 Neoplasm of uncertain behavior of skin: Secondary | ICD-10-CM | POA: Diagnosis not present

## 2018-04-20 DIAGNOSIS — K449 Diaphragmatic hernia without obstruction or gangrene: Secondary | ICD-10-CM | POA: Diagnosis not present

## 2018-04-20 DIAGNOSIS — R1084 Generalized abdominal pain: Secondary | ICD-10-CM | POA: Diagnosis not present

## 2018-04-20 DIAGNOSIS — R195 Other fecal abnormalities: Secondary | ICD-10-CM | POA: Diagnosis not present

## 2018-04-20 DIAGNOSIS — K5901 Slow transit constipation: Secondary | ICD-10-CM | POA: Diagnosis not present

## 2018-05-09 DIAGNOSIS — M542 Cervicalgia: Secondary | ICD-10-CM | POA: Diagnosis not present

## 2018-05-09 DIAGNOSIS — M47812 Spondylosis without myelopathy or radiculopathy, cervical region: Secondary | ICD-10-CM | POA: Diagnosis not present

## 2018-05-09 DIAGNOSIS — M797 Fibromyalgia: Secondary | ICD-10-CM | POA: Diagnosis not present

## 2018-05-09 DIAGNOSIS — G894 Chronic pain syndrome: Secondary | ICD-10-CM | POA: Diagnosis not present

## 2018-05-11 DIAGNOSIS — D122 Benign neoplasm of ascending colon: Secondary | ICD-10-CM | POA: Diagnosis not present

## 2018-05-11 DIAGNOSIS — D124 Benign neoplasm of descending colon: Secondary | ICD-10-CM | POA: Diagnosis not present

## 2018-05-11 DIAGNOSIS — K635 Polyp of colon: Secondary | ICD-10-CM | POA: Diagnosis not present

## 2018-05-11 DIAGNOSIS — K621 Rectal polyp: Secondary | ICD-10-CM | POA: Diagnosis not present

## 2018-05-11 DIAGNOSIS — Z1211 Encounter for screening for malignant neoplasm of colon: Secondary | ICD-10-CM | POA: Diagnosis not present

## 2018-05-11 DIAGNOSIS — K5730 Diverticulosis of large intestine without perforation or abscess without bleeding: Secondary | ICD-10-CM | POA: Diagnosis not present

## 2018-05-11 DIAGNOSIS — D123 Benign neoplasm of transverse colon: Secondary | ICD-10-CM | POA: Diagnosis not present

## 2018-05-13 DIAGNOSIS — D124 Benign neoplasm of descending colon: Secondary | ICD-10-CM | POA: Diagnosis not present

## 2018-05-13 DIAGNOSIS — K635 Polyp of colon: Secondary | ICD-10-CM | POA: Diagnosis not present

## 2018-05-13 DIAGNOSIS — D123 Benign neoplasm of transverse colon: Secondary | ICD-10-CM | POA: Diagnosis not present

## 2018-05-13 DIAGNOSIS — D122 Benign neoplasm of ascending colon: Secondary | ICD-10-CM | POA: Diagnosis not present

## 2018-05-26 DIAGNOSIS — C44629 Squamous cell carcinoma of skin of left upper limb, including shoulder: Secondary | ICD-10-CM | POA: Diagnosis not present

## 2018-05-26 DIAGNOSIS — Z85828 Personal history of other malignant neoplasm of skin: Secondary | ICD-10-CM | POA: Diagnosis not present

## 2018-06-03 DIAGNOSIS — J449 Chronic obstructive pulmonary disease, unspecified: Secondary | ICD-10-CM | POA: Diagnosis not present

## 2018-06-06 DIAGNOSIS — G894 Chronic pain syndrome: Secondary | ICD-10-CM | POA: Diagnosis not present

## 2018-06-06 DIAGNOSIS — M542 Cervicalgia: Secondary | ICD-10-CM | POA: Diagnosis not present

## 2018-06-06 DIAGNOSIS — M797 Fibromyalgia: Secondary | ICD-10-CM | POA: Diagnosis not present

## 2018-06-06 DIAGNOSIS — M47812 Spondylosis without myelopathy or radiculopathy, cervical region: Secondary | ICD-10-CM | POA: Diagnosis not present

## 2018-06-16 DIAGNOSIS — L570 Actinic keratosis: Secondary | ICD-10-CM | POA: Diagnosis not present

## 2018-06-16 DIAGNOSIS — D485 Neoplasm of uncertain behavior of skin: Secondary | ICD-10-CM | POA: Diagnosis not present

## 2018-06-16 DIAGNOSIS — L820 Inflamed seborrheic keratosis: Secondary | ICD-10-CM | POA: Diagnosis not present

## 2018-06-16 DIAGNOSIS — Z85828 Personal history of other malignant neoplasm of skin: Secondary | ICD-10-CM | POA: Diagnosis not present

## 2018-08-03 DIAGNOSIS — F419 Anxiety disorder, unspecified: Secondary | ICD-10-CM | POA: Diagnosis not present

## 2018-08-03 DIAGNOSIS — R251 Tremor, unspecified: Secondary | ICD-10-CM | POA: Diagnosis not present

## 2018-08-03 DIAGNOSIS — M25512 Pain in left shoulder: Secondary | ICD-10-CM | POA: Diagnosis not present

## 2018-08-03 DIAGNOSIS — K219 Gastro-esophageal reflux disease without esophagitis: Secondary | ICD-10-CM | POA: Diagnosis not present

## 2018-08-04 DIAGNOSIS — M797 Fibromyalgia: Secondary | ICD-10-CM | POA: Diagnosis not present

## 2018-08-04 DIAGNOSIS — G894 Chronic pain syndrome: Secondary | ICD-10-CM | POA: Diagnosis not present

## 2018-08-04 DIAGNOSIS — Z79891 Long term (current) use of opiate analgesic: Secondary | ICD-10-CM | POA: Diagnosis not present

## 2018-08-04 DIAGNOSIS — M545 Low back pain: Secondary | ICD-10-CM | POA: Diagnosis not present

## 2018-08-04 DIAGNOSIS — M47812 Spondylosis without myelopathy or radiculopathy, cervical region: Secondary | ICD-10-CM | POA: Diagnosis not present

## 2018-08-04 DIAGNOSIS — Z79899 Other long term (current) drug therapy: Secondary | ICD-10-CM | POA: Diagnosis not present

## 2018-08-12 DIAGNOSIS — M25512 Pain in left shoulder: Secondary | ICD-10-CM | POA: Diagnosis not present

## 2018-08-17 DIAGNOSIS — R251 Tremor, unspecified: Secondary | ICD-10-CM | POA: Diagnosis not present

## 2018-08-17 DIAGNOSIS — F419 Anxiety disorder, unspecified: Secondary | ICD-10-CM | POA: Diagnosis not present

## 2018-08-22 DIAGNOSIS — J449 Chronic obstructive pulmonary disease, unspecified: Secondary | ICD-10-CM | POA: Diagnosis not present

## 2018-09-01 DIAGNOSIS — M47812 Spondylosis without myelopathy or radiculopathy, cervical region: Secondary | ICD-10-CM | POA: Diagnosis not present

## 2018-09-01 DIAGNOSIS — M545 Low back pain: Secondary | ICD-10-CM | POA: Diagnosis not present

## 2018-09-01 DIAGNOSIS — G894 Chronic pain syndrome: Secondary | ICD-10-CM | POA: Diagnosis not present

## 2018-09-01 DIAGNOSIS — M797 Fibromyalgia: Secondary | ICD-10-CM | POA: Diagnosis not present

## 2018-09-12 DIAGNOSIS — M25512 Pain in left shoulder: Secondary | ICD-10-CM | POA: Diagnosis not present

## 2018-09-29 DIAGNOSIS — M47812 Spondylosis without myelopathy or radiculopathy, cervical region: Secondary | ICD-10-CM | POA: Diagnosis not present

## 2018-09-29 DIAGNOSIS — G894 Chronic pain syndrome: Secondary | ICD-10-CM | POA: Diagnosis not present

## 2018-09-29 DIAGNOSIS — M797 Fibromyalgia: Secondary | ICD-10-CM | POA: Diagnosis not present

## 2018-09-29 DIAGNOSIS — Z79899 Other long term (current) drug therapy: Secondary | ICD-10-CM | POA: Diagnosis not present

## 2018-09-29 DIAGNOSIS — M25519 Pain in unspecified shoulder: Secondary | ICD-10-CM | POA: Diagnosis not present

## 2018-09-29 DIAGNOSIS — Z79891 Long term (current) use of opiate analgesic: Secondary | ICD-10-CM | POA: Diagnosis not present

## 2018-10-27 DIAGNOSIS — M545 Low back pain: Secondary | ICD-10-CM | POA: Diagnosis not present

## 2018-10-27 DIAGNOSIS — M47812 Spondylosis without myelopathy or radiculopathy, cervical region: Secondary | ICD-10-CM | POA: Diagnosis not present

## 2018-10-27 DIAGNOSIS — M797 Fibromyalgia: Secondary | ICD-10-CM | POA: Diagnosis not present

## 2018-10-27 DIAGNOSIS — G894 Chronic pain syndrome: Secondary | ICD-10-CM | POA: Diagnosis not present

## 2018-10-31 DIAGNOSIS — R1013 Epigastric pain: Secondary | ICD-10-CM | POA: Diagnosis not present

## 2018-10-31 DIAGNOSIS — K449 Diaphragmatic hernia without obstruction or gangrene: Secondary | ICD-10-CM | POA: Diagnosis not present

## 2018-10-31 DIAGNOSIS — F419 Anxiety disorder, unspecified: Secondary | ICD-10-CM | POA: Diagnosis not present

## 2018-11-10 ENCOUNTER — Other Ambulatory Visit: Payer: Self-pay | Admitting: Gastroenterology

## 2018-11-10 DIAGNOSIS — R1311 Dysphagia, oral phase: Secondary | ICD-10-CM | POA: Diagnosis not present

## 2018-11-10 DIAGNOSIS — K5901 Slow transit constipation: Secondary | ICD-10-CM | POA: Diagnosis not present

## 2018-11-10 DIAGNOSIS — K449 Diaphragmatic hernia without obstruction or gangrene: Secondary | ICD-10-CM

## 2018-11-15 ENCOUNTER — Ambulatory Visit
Admission: RE | Admit: 2018-11-15 | Discharge: 2018-11-15 | Disposition: A | Discharge status: Home / Self Care | Payer: Medicare Other | Source: Ambulatory Visit | Attending: Gastroenterology | Admitting: Gastroenterology

## 2018-11-15 DIAGNOSIS — K449 Diaphragmatic hernia without obstruction or gangrene: Secondary | ICD-10-CM

## 2018-11-15 DIAGNOSIS — R1310 Dysphagia, unspecified: Secondary | ICD-10-CM | POA: Diagnosis not present

## 2018-11-15 DIAGNOSIS — R1311 Dysphagia, oral phase: Secondary | ICD-10-CM

## 2018-11-17 ENCOUNTER — Other Ambulatory Visit: Payer: Self-pay | Admitting: Gastroenterology

## 2018-12-02 DIAGNOSIS — M47812 Spondylosis without myelopathy or radiculopathy, cervical region: Secondary | ICD-10-CM | POA: Diagnosis not present

## 2018-12-02 DIAGNOSIS — M25519 Pain in unspecified shoulder: Secondary | ICD-10-CM | POA: Diagnosis not present

## 2018-12-02 DIAGNOSIS — M797 Fibromyalgia: Secondary | ICD-10-CM | POA: Diagnosis not present

## 2018-12-02 DIAGNOSIS — G894 Chronic pain syndrome: Secondary | ICD-10-CM | POA: Diagnosis not present

## 2018-12-13 ENCOUNTER — Other Ambulatory Visit (HOSPITAL_COMMUNITY)
Admission: RE | Admit: 2018-12-13 | Discharge: 2018-12-13 | Disposition: A | Discharge status: Home / Self Care | Payer: Medicare Other | Source: Ambulatory Visit | Attending: Gastroenterology | Admitting: Gastroenterology

## 2018-12-13 ENCOUNTER — Other Ambulatory Visit: Payer: Self-pay

## 2018-12-13 DIAGNOSIS — Z01812 Encounter for preprocedural laboratory examination: Secondary | ICD-10-CM | POA: Insufficient documentation

## 2018-12-13 DIAGNOSIS — Z20828 Contact with and (suspected) exposure to other viral communicable diseases: Secondary | ICD-10-CM | POA: Insufficient documentation

## 2018-12-14 ENCOUNTER — Encounter (HOSPITAL_COMMUNITY): Payer: Self-pay

## 2018-12-14 ENCOUNTER — Other Ambulatory Visit: Payer: Self-pay

## 2018-12-14 LAB — NOVEL CORONAVIRUS, NAA (HOSP ORDER, SEND-OUT TO REF LAB; TAT 18-24 HRS): SARS-CoV-2, NAA: NOT DETECTED

## 2018-12-14 NOTE — Progress Notes (Signed)
SPOKE W/  [facility]     SCREENING SYMPTOMS OF COVID 19:   COUGH--NO  RUNNY NOSE--- NO  SORE THROAT---NO  NASAL CONGESTION----NO  SNEEZING----NO  SHORTNESS OF BREATH---NO  DIFFICULTY BREATHING---NO  TEMP >100.0 -----NO  UNEXPLAINED BODY ACHES------NO  CHILLS -------- NO  HEADACHES ---------NO  LOSS OF SMELL/ TASTE --------NO    HAVE YOU OR ANY FAMILY MEMBER TRAVELLED PAST 14 DAYS OUT OF THE   COUNTY---NO STATE----NO COUNTRY----NO  HAVE YOU OR ANY FAMILY MEMBER BEEN EXPOSED TO ANYONE WITH COVID 19? NO

## 2018-12-15 NOTE — Progress Notes (Signed)
Attempted pre call for procedure 12/16/18, no answer, no voicemail set up.

## 2018-12-16 ENCOUNTER — Encounter (HOSPITAL_COMMUNITY)
Admission: RE | Disposition: A | Discharge status: Home / Self Care | Payer: Self-pay | Source: Ambulatory Visit | Attending: Gastroenterology

## 2018-12-16 ENCOUNTER — Ambulatory Visit (HOSPITAL_COMMUNITY): Payer: Medicare Other | Admitting: Anesthesiology

## 2018-12-16 ENCOUNTER — Ambulatory Visit (HOSPITAL_COMMUNITY)
Admission: RE | Admit: 2018-12-16 | Discharge: 2018-12-16 | Disposition: A | Discharge status: Home / Self Care | Payer: Medicare Other | Source: Ambulatory Visit | Attending: Gastroenterology | Admitting: Gastroenterology

## 2018-12-16 ENCOUNTER — Other Ambulatory Visit: Payer: Self-pay

## 2018-12-16 ENCOUNTER — Encounter (HOSPITAL_COMMUNITY): Payer: Self-pay | Admitting: *Deleted

## 2018-12-16 DIAGNOSIS — F419 Anxiety disorder, unspecified: Secondary | ICD-10-CM | POA: Diagnosis not present

## 2018-12-16 DIAGNOSIS — K449 Diaphragmatic hernia without obstruction or gangrene: Secondary | ICD-10-CM | POA: Diagnosis not present

## 2018-12-16 DIAGNOSIS — E7800 Pure hypercholesterolemia, unspecified: Secondary | ICD-10-CM | POA: Insufficient documentation

## 2018-12-16 DIAGNOSIS — R933 Abnormal findings on diagnostic imaging of other parts of digestive tract: Secondary | ICD-10-CM | POA: Insufficient documentation

## 2018-12-16 DIAGNOSIS — Z8249 Family history of ischemic heart disease and other diseases of the circulatory system: Secondary | ICD-10-CM | POA: Insufficient documentation

## 2018-12-16 DIAGNOSIS — Z823 Family history of stroke: Secondary | ICD-10-CM | POA: Diagnosis not present

## 2018-12-16 DIAGNOSIS — J449 Chronic obstructive pulmonary disease, unspecified: Secondary | ICD-10-CM | POA: Diagnosis not present

## 2018-12-16 DIAGNOSIS — Z9981 Dependence on supplemental oxygen: Secondary | ICD-10-CM | POA: Diagnosis not present

## 2018-12-16 DIAGNOSIS — E0590 Thyrotoxicosis, unspecified without thyrotoxic crisis or storm: Secondary | ICD-10-CM | POA: Diagnosis not present

## 2018-12-16 DIAGNOSIS — M797 Fibromyalgia: Secondary | ICD-10-CM | POA: Insufficient documentation

## 2018-12-16 DIAGNOSIS — Z8719 Personal history of other diseases of the digestive system: Secondary | ICD-10-CM | POA: Insufficient documentation

## 2018-12-16 DIAGNOSIS — R7303 Prediabetes: Secondary | ICD-10-CM | POA: Insufficient documentation

## 2018-12-16 DIAGNOSIS — K224 Dyskinesia of esophagus: Secondary | ICD-10-CM | POA: Insufficient documentation

## 2018-12-16 DIAGNOSIS — Z8614 Personal history of Methicillin resistant Staphylococcus aureus infection: Secondary | ICD-10-CM | POA: Diagnosis not present

## 2018-12-16 DIAGNOSIS — M810 Age-related osteoporosis without current pathological fracture: Secondary | ICD-10-CM | POA: Insufficient documentation

## 2018-12-16 DIAGNOSIS — K3184 Gastroparesis: Secondary | ICD-10-CM | POA: Diagnosis not present

## 2018-12-16 DIAGNOSIS — M1990 Unspecified osteoarthritis, unspecified site: Secondary | ICD-10-CM | POA: Diagnosis not present

## 2018-12-16 DIAGNOSIS — Z79899 Other long term (current) drug therapy: Secondary | ICD-10-CM | POA: Diagnosis not present

## 2018-12-16 DIAGNOSIS — E785 Hyperlipidemia, unspecified: Secondary | ICD-10-CM | POA: Insufficient documentation

## 2018-12-16 DIAGNOSIS — K219 Gastro-esophageal reflux disease without esophagitis: Secondary | ICD-10-CM | POA: Diagnosis not present

## 2018-12-16 DIAGNOSIS — E559 Vitamin D deficiency, unspecified: Secondary | ICD-10-CM | POA: Diagnosis not present

## 2018-12-16 DIAGNOSIS — R1310 Dysphagia, unspecified: Secondary | ICD-10-CM | POA: Insufficient documentation

## 2018-12-16 HISTORY — DX: Presence of dental prosthetic device (complete) (partial): Z97.2

## 2018-12-16 HISTORY — DX: Hyperlipidemia, unspecified: E78.5

## 2018-12-16 HISTORY — DX: Unspecified osteoarthritis, unspecified site: M19.90

## 2018-12-16 HISTORY — PX: BALLOON DILATION: SHX5330

## 2018-12-16 HISTORY — PX: ESOPHAGOGASTRODUODENOSCOPY (EGD) WITH PROPOFOL: SHX5813

## 2018-12-16 HISTORY — DX: Fracture of left shoulder girdle, part unspecified, initial encounter for closed fracture: S42.92XA

## 2018-12-16 HISTORY — DX: Unspecified malignant neoplasm of skin, unspecified: C44.90

## 2018-12-16 HISTORY — DX: Personal history of other diseases of the digestive system: Z87.19

## 2018-12-16 HISTORY — DX: Dysphagia, unspecified: R13.10

## 2018-12-16 HISTORY — DX: Gastro-esophageal reflux disease without esophagitis: K21.9

## 2018-12-16 HISTORY — DX: Presence of spectacles and contact lenses: Z97.3

## 2018-12-16 SURGERY — ESOPHAGOGASTRODUODENOSCOPY (EGD) WITH PROPOFOL
Anesthesia: Monitor Anesthesia Care

## 2018-12-16 MED ORDER — LACTATED RINGERS IV SOLN
INTRAVENOUS | Status: DC
  Administered 2018-12-16: 1000 mL via INTRAVENOUS

## 2018-12-16 MED ORDER — SODIUM CHLORIDE 0.9 % IV SOLN: INTRAVENOUS | Status: DC

## 2018-12-16 MED ORDER — PROPOFOL 10 MG/ML IV BOLUS
INTRAVENOUS | Status: AC
  Filled 2018-12-16: qty 20

## 2018-12-16 MED ORDER — PROPOFOL 500 MG/50ML IV EMUL
INTRAVENOUS | Status: DC | PRN
  Administered 2018-12-16: 100 ug/kg/min via INTRAVENOUS

## 2018-12-16 MED ORDER — PROPOFOL 10 MG/ML IV BOLUS
INTRAVENOUS | Status: DC | PRN
  Administered 2018-12-16: 20 mg via INTRAVENOUS

## 2018-12-16 SURGICAL SUPPLY — 15 items

## 2018-12-16 NOTE — Discharge Instructions (Signed)
Call if question or problem otherwise start with soft foods and slowly YOU HAD AN ENDOSCOPIC PROCEDURE TODAY: Refer to the procedure report and other information in the discharge instructions given to you for any specific questions about what was found during the examination. If this information does not answer your questions, please call [facility] office at [phone] to clarify.   YOU SHOULD EXPECT: Some feelings of bloating in the abdomen. Passage of more gas than usual. Walking can help get rid of the air that was put into your GI tract during the procedure and reduce the bloating. If you had a lower endoscopy (such as a colonoscopy or flexible sigmoidoscopy) you may notice spotting of blood in your stool or on the toilet paper. Some abdominal soreness may be present for a day or two, also.  DIET: Your first meal following the procedure should be a light meal and then it is ok to progress to your normal diet. A half-sandwich or bowl of soup is an example of a good first meal. Heavy or fried foods are harder to digest and may make you feel nauseous or bloated. Drink plenty of fluids but you should avoid alcoholic beverages for 24 hours. If you had a esophageal dilation, please see attached instructions for diet.   ACTIVITY: Your care partner should take you home directly after the procedure. You should plan to take it easy, moving slowly for the rest of the day. You can resume normal activity the day after the procedure however YOU SHOULD NOT DRIVE, use power tools, machinery or perform tasks that involve climbing or major physical exertion for 24 hours (because of the sedation medicines used during the test).   SYMPTOMS TO REPORT IMMEDIATELY: A gastroenterologist can be reached at any hour. Please call [phone]  for any of the following symptoms:   Following upper endoscopy (EGD,Esophageal dilation) Vomiting of blood or coffee ground material  New, significant abdominal pain  New, significant  chest pain or pain under the shoulder blades  Painful or persistently difficult swallowing  New shortness of breath  Black, tarry-looking or red, bloody stools  FOLLOW UP:  If any biopsies were taken you will be contacted by phone or by letter within the next 1-3 weeks. Call [phone]  if you have not heard about the biopsies in 3 weeks.  Please also call with any specific questions about appointments or follow up tests. advance diet and call if no better in 2 weeks otherwise follow-up in 4 weeks

## 2018-12-16 NOTE — Transfer of Care (Signed)
Immediate Anesthesia Transfer of Care Note  Patient: Elizabeth Velazquez  Procedure(s) Performed: Procedure(s): ESOPHAGOGASTRODUODENOSCOPY (EGD) WITH PROPOFOL, POSSIBLE BALLOON DILATION (N/A) BALLOON DILATION (N/A)  Patient Location: PACU  Anesthesia Type:MAC  Level of Consciousness:  sedated, patient cooperative and responds to stimulation  Airway & Oxygen Therapy:Patient Spontanous Breathing and Patient connected to face mask oxgen  Post-op Assessment:  Report given to PACU RN and Post -op Vital signs reviewed and stable  Post vital signs:  Reviewed and stable  Last Vitals:  Vitals:   12/16/18 0751  BP: (!) 158/73  Pulse: 79  Resp: 19  Temp: 36.5 C  SpO2: 43%    Complications: No apparent anesthesia complications

## 2018-12-16 NOTE — Progress Notes (Signed)
[name] 8:37 AM  Subjective: Patient without any new complaints since we had a recent office visit and her food gets caught in the midepigastric area and we rediscussed her procedure  Objective: Vital signs stable afebrile no acute distress exam please see preassessment evaluation  Assessment: Dysphasia abnormal barium swallow  Plan: Okay to proceed with endoscopy and probable dilation with anesthesia assistance  [facility]  office [phone] After 5PM or if no answer call [phone]

## 2018-12-16 NOTE — Anesthesia Preprocedure Evaluation (Addendum)
Anesthesia Evaluation  Patient identified by MRN, date of birth, ID band Patient awake    Reviewed: Allergy & Precautions, NPO status , Patient's Chart, lab work & pertinent test results  Airway Mallampati: II  TM Distance: >3 FB Neck ROM: Full    Dental  (+) Upper Dentures   Pulmonary shortness of breath, COPD,  COPD inhaler and oxygen dependent, Current SmokerPatient did not abstain from smoking.,    Pulmonary exam normal breath sounds clear to auscultation       Cardiovascular negative cardio ROS Normal cardiovascular exam Rhythm:Regular Rate:Normal     Neuro/Psych Anxiety negative neurological ROS     GI/Hepatic Neg liver ROS, hiatal hernia, GERD  Medicated and Controlled,  Endo/Other  negative endocrine ROS  Renal/GU negative Renal ROS     Musculoskeletal negative musculoskeletal ROS (+)   Abdominal   Peds  Hematology HLD   Anesthesia Other Findings dysphagia  Reproductive/Obstetrics                            Anesthesia Physical Anesthesia Plan  ASA: IV  Anesthesia Plan: MAC   Post-op Pain Management:    Induction: Intravenous  PONV Risk Score and Plan: 1 and Propofol infusion  Airway Management Planned: Nasal Cannula  Additional Equipment:   Intra-op Plan:   Post-operative Plan:   Informed Consent: I have reviewed the patients History and Physical, chart, labs and discussed the procedure including the risks, benefits and alternatives for the proposed anesthesia with the patient or authorized representative who has indicated his/her understanding and acceptance.     Dental advisory given  Plan Discussed with: CRNA  Anesthesia Plan Comments:         Anesthesia Quick Evaluation

## 2018-12-16 NOTE — Anesthesia Postprocedure Evaluation (Signed)
Anesthesia Post Note  Patient: Elizabeth Velazquez  Procedure(s) Performed: ESOPHAGOGASTRODUODENOSCOPY (EGD) WITH PROPOFOL, POSSIBLE BALLOON DILATION (N/A ) BALLOON DILATION (N/A )     Patient location during evaluation: PACU Anesthesia Type: MAC Level of consciousness: awake Pain management: pain level controlled Vital Signs Assessment: post-procedure vital signs reviewed and stable Respiratory status: spontaneous breathing, nonlabored ventilation, respiratory function stable and patient connected to nasal cannula oxygen Cardiovascular status: stable and blood pressure returned to baseline Postop Assessment: no apparent nausea or vomiting Anesthetic complications: no    Last Vitals:  Vitals:   12/16/18 0935 12/16/18 0940  BP:  (!) 156/75  Pulse: 68 72  Resp: 20 (!) 27  Temp:    SpO2: 97% 96%    Last Pain:  Vitals:   12/16/18 0940  TempSrc:   PainSc: 0-No pain                 Ryan P Ellender

## 2018-12-16 NOTE — Op Note (Signed)
[facility] Patient Name: Elizabeth Velazquez Procedure Date: 12/16/2018 MRN: [id number] Attending MD: Clarene Essex , MD Date of Birth: Jan 13, 1941 CSN: [id number] Age: 78 Admit Type: Outpatient Procedure:                Upper GI endoscopy Indications:              Dysphagia, Abnormal UGI series-barium swallow Providers:                Clarene Essex, MD, Cleda Daub, RN, Marguerita Merles,                            Technician Referring MD:              Medicines:                Propofol total dose 70 mg IV Complications:            No immediate complications. Estimated Blood Loss:     Estimated blood loss: none. Procedure:                Pre-Anesthesia Assessment:                           - Prior to the procedure, a History and Physical                            was performed, and patient medications and                            allergies were reviewed. The patient's tolerance of                            previous anesthesia was also reviewed. The risks                            and benefits of the procedure and the sedation                            options and risks were discussed with the patient.                            All questions were answered, and informed consent                            was obtained. Prior Anticoagulants: The patient has                            taken no previous anticoagulant or antiplatelet                            agents. ASA Grade Assessment: II - A patient with                            mild systemic disease. After reviewing the risks  and benefits, the patient was deemed in                            satisfactory condition to undergo the procedure.                           After obtaining informed consent, the endoscope was                            passed under direct vision. Throughout the                            procedure, the patient's blood pressure, pulse, and   oxygen saturations were monitored continuously. The                            [id number]-[id number] Olympus gastroscope was                            introduced through the mouth, and advanced to the                            third part of duodenum. The upper GI endoscopy was                            accomplished without difficulty. The patient                            tolerated the procedure well. Scope In: Scope Out: Findings:      The larynx was normal.      Abnormal motility was noted in the distal esophagus. There is spasticity       of the esophageal body. The distal esophagus/lower esophageal sphincter       is spastic, but gives up passage to the endoscope. A TTS dilator was       passed through the scope. Dilation with a 15-16.5-18 mm balloon dilator       was performed to 18 mm. The dilation site was examined and showed no       change and no obvious complication all 3 balloon sizes were used the       customary fashion.      The entire examined stomach was normal. Except for very minimal diffuse       gastritis      The duodenal bulb, first portion of the duodenum, second portion of the       duodenum and third portion of the duodenum were normal.      The exam was otherwise without abnormality.      A small hiatal hernia was present. Impression:               - Normal larynx.                           - Abnormal esophageal motility, suspicious for                            esophageal spasm. Dilated.                           -  Essentially normal stomach.                           - Normal duodenal bulb, first portion of the                            duodenum, second portion of the duodenum and third                            portion of the duodenum.                           - The examination was otherwise normal.                           - Small hiatal hernia.                           - No specimens collected. Moderate Sedation:      Not Applicable - Patient had  care per Anesthesia. Recommendation:           - Patient has a contact number available for                            emergencies. The signs and symptoms of potential                            delayed complications were discussed with the                            patient. Return to normal activities tomorrow.                            Written discharge instructions were provided to the                            patient.                           - Soft diet today.                           - Continue present medications.                           - Return to GI clinic in 1 month.                           - Telephone GI clinic if symptomatic PRN.                           - Perform an upper GI endoscopy PRN with Botox next                            if this is not helpful. Procedure Code(s):        --- Professional ---  [phone], Esophagogastroduodenoscopy, flexible,                            transoral; with transendoscopic balloon dilation of                            esophagus (less than 30 mm diameter) Diagnosis Code(s):        --- Professional ---                           K22.4, Dyskinesia of esophagus                           R13.10, Dysphagia, unspecified                           R93.3, Abnormal findings on diagnostic imaging of                            other parts of digestive tract CPT copyright 2019 [employer]. All rights reserved. The codes documented in this report are preliminary and upon coder review may  be revised to meet current compliance requirements. Clarene Essex, MD 12/16/2018 9:28:18 AM This report has been signed electronically. Number of Addenda: 0

## 2018-12-19 ENCOUNTER — Encounter (HOSPITAL_COMMUNITY): Payer: Self-pay | Admitting: Gastroenterology

## 2018-12-30 DIAGNOSIS — M130 Polyarthritis, unspecified: Secondary | ICD-10-CM | POA: Diagnosis not present

## 2018-12-30 DIAGNOSIS — M2550 Pain in unspecified joint: Secondary | ICD-10-CM | POA: Diagnosis not present

## 2018-12-30 DIAGNOSIS — Z79891 Long term (current) use of opiate analgesic: Secondary | ICD-10-CM | POA: Diagnosis not present

## 2018-12-30 DIAGNOSIS — Z79899 Other long term (current) drug therapy: Secondary | ICD-10-CM | POA: Diagnosis not present

## 2018-12-30 DIAGNOSIS — G894 Chronic pain syndrome: Secondary | ICD-10-CM | POA: Diagnosis not present

## 2019-01-27 DIAGNOSIS — M47812 Spondylosis without myelopathy or radiculopathy, cervical region: Secondary | ICD-10-CM | POA: Diagnosis not present

## 2019-01-27 DIAGNOSIS — M25519 Pain in unspecified shoulder: Secondary | ICD-10-CM | POA: Diagnosis not present

## 2019-01-27 DIAGNOSIS — G894 Chronic pain syndrome: Secondary | ICD-10-CM | POA: Diagnosis not present

## 2019-01-27 DIAGNOSIS — M797 Fibromyalgia: Secondary | ICD-10-CM | POA: Diagnosis not present

## 2019-02-28 DIAGNOSIS — M797 Fibromyalgia: Secondary | ICD-10-CM | POA: Diagnosis not present

## 2019-02-28 DIAGNOSIS — M47812 Spondylosis without myelopathy or radiculopathy, cervical region: Secondary | ICD-10-CM | POA: Diagnosis not present

## 2019-02-28 DIAGNOSIS — M545 Low back pain: Secondary | ICD-10-CM | POA: Diagnosis not present

## 2019-02-28 DIAGNOSIS — G894 Chronic pain syndrome: Secondary | ICD-10-CM | POA: Diagnosis not present

## 2019-03-08 DIAGNOSIS — R7301 Impaired fasting glucose: Secondary | ICD-10-CM | POA: Diagnosis not present

## 2019-03-08 DIAGNOSIS — E7800 Pure hypercholesterolemia, unspecified: Secondary | ICD-10-CM | POA: Diagnosis not present

## 2019-03-08 DIAGNOSIS — R7303 Prediabetes: Secondary | ICD-10-CM | POA: Diagnosis not present

## 2019-03-08 DIAGNOSIS — E559 Vitamin D deficiency, unspecified: Secondary | ICD-10-CM | POA: Diagnosis not present

## 2019-03-08 DIAGNOSIS — M810 Age-related osteoporosis without current pathological fracture: Secondary | ICD-10-CM | POA: Diagnosis not present

## 2019-03-08 DIAGNOSIS — E0590 Thyrotoxicosis, unspecified without thyrotoxic crisis or storm: Secondary | ICD-10-CM | POA: Diagnosis not present

## 2019-03-20 DIAGNOSIS — E559 Vitamin D deficiency, unspecified: Secondary | ICD-10-CM | POA: Diagnosis not present

## 2019-03-20 DIAGNOSIS — E7800 Pure hypercholesterolemia, unspecified: Secondary | ICD-10-CM | POA: Diagnosis not present

## 2019-03-20 DIAGNOSIS — K219 Gastro-esophageal reflux disease without esophagitis: Secondary | ICD-10-CM | POA: Diagnosis not present

## 2019-03-20 DIAGNOSIS — Z0000 Encounter for general adult medical examination without abnormal findings: Secondary | ICD-10-CM | POA: Diagnosis not present

## 2019-03-29 DIAGNOSIS — Z79891 Long term (current) use of opiate analgesic: Secondary | ICD-10-CM | POA: Diagnosis not present

## 2019-03-29 DIAGNOSIS — M2550 Pain in unspecified joint: Secondary | ICD-10-CM | POA: Diagnosis not present

## 2019-03-29 DIAGNOSIS — M130 Polyarthritis, unspecified: Secondary | ICD-10-CM | POA: Diagnosis not present

## 2019-03-29 DIAGNOSIS — G894 Chronic pain syndrome: Secondary | ICD-10-CM | POA: Diagnosis not present

## 2019-03-29 DIAGNOSIS — Z79899 Other long term (current) drug therapy: Secondary | ICD-10-CM | POA: Diagnosis not present

## 2019-04-10 ENCOUNTER — Ambulatory Visit: Payer: Medicare Other | Admitting: Cardiology

## 2019-04-10 ENCOUNTER — Encounter: Payer: Self-pay | Admitting: Cardiology

## 2019-04-10 ENCOUNTER — Other Ambulatory Visit: Payer: Self-pay

## 2019-04-10 VITALS — BP 162/89 | HR 100 | Temp 97.8°F | Ht <= 58 in | Wt 123.0 lb

## 2019-04-10 DIAGNOSIS — E782 Mixed hyperlipidemia: Secondary | ICD-10-CM | POA: Diagnosis not present

## 2019-04-10 DIAGNOSIS — I2510 Atherosclerotic heart disease of native coronary artery without angina pectoris: Secondary | ICD-10-CM | POA: Diagnosis not present

## 2019-04-10 DIAGNOSIS — F17200 Nicotine dependence, unspecified, uncomplicated: Secondary | ICD-10-CM | POA: Diagnosis not present

## 2019-04-10 DIAGNOSIS — J439 Emphysema, unspecified: Secondary | ICD-10-CM | POA: Diagnosis not present

## 2019-04-10 DIAGNOSIS — F17210 Nicotine dependence, cigarettes, uncomplicated: Secondary | ICD-10-CM | POA: Insufficient documentation

## 2019-04-10 DIAGNOSIS — I10 Essential (primary) hypertension: Secondary | ICD-10-CM | POA: Diagnosis not present

## 2019-04-10 MED ORDER — AMLODIPINE BESYLATE 5 MG PO TABS: 5.0000 mg | ORAL_TABLET | Freq: Every day | ORAL | 3 refills | Status: DC

## 2019-04-10 MED ORDER — ASPIRIN EC 81 MG PO TBEC: 81.0000 mg | DELAYED_RELEASE_TABLET | Freq: Every day | ORAL | 3 refills | Status: AC

## 2019-04-10 NOTE — Progress Notes (Signed)
Patient referred by Deland Pretty, MD for coronary atherosclerosis  Subjective:   Elizabeth Velazquez, female    DOB: November 02, 1940, 79 y.o.   MRN: [id number]   Chief Complaint  Patient presents with  . Coronary Artery Disease  . Chest Pain    HPI  79 y.o. Caucasian female with hyperlipidemia, GERD, COPD, tobacco dependence, referred for management of coronary artery disease noted on CT chest.  Coronary atherosclerosis was noted on lung cancer with CT chest in April 2019.  Patient is a current 1 pack/day smoker, with >100 PY smoking history.  She has had COPD for at least 15 years.  She is on as needed oxygen at home, but continues to smoke.  She has stable exertional dyspnea, but does not have any anginal symptoms.  She has pain in multiple joints, deemed as fibromyalgia.  She is on treatment with Vicodin and clonazepam, through her PCP.  Patient lives with her son.  She performs her ADLs without much difficulty.  However, she needs help with grocery shopping etc.  Past Medical History:  Diagnosis Date  . Anxiety   . Arthritis   . COPD (chronic obstructive pulmonary disease) ([facility])   . Dysphagia   . GERD (gastroesophageal reflux disease)   . History of hiatal hernia   . Hyperlipidemia   . Shoulder fracture, left   . Skin cancer   . Wears dentures   . Wears glasses      Past Surgical History:  Procedure Laterality Date  . ABDOMINAL HYSTERECTOMY    . BALLOON DILATION N/A 12/16/2018   Procedure: BALLOON DILATION;  Surgeon: Clarene Essex, MD;  Location: [facility];  Service: Endoscopy;  Laterality: N/A;  . COLONOSCOPY    . ESOPHAGOGASTRODUODENOSCOPY (EGD) WITH PROPOFOL N/A 12/16/2018   Procedure: ESOPHAGOGASTRODUODENOSCOPY (EGD) WITH PROPOFOL, POSSIBLE BALLOON DILATION;  Surgeon: Clarene Essex, MD;  Location: [facility];  Service: Endoscopy;  Laterality: N/A;  . HEMORRHOID SURGERY    . UPPER GI ENDOSCOPY       Social History   Tobacco Use  Smoking Status Current Every Day  Smoker  . Packs/day: 2.00  . Years: 65.00  . Pack years: 130.00  . Types: Cigarettes  Smokeless Tobacco Never Used    Social History   Substance and Sexual Activity  Alcohol Use No     Family History  Problem Relation Age of Onset  . Stroke Mother   . Alcohol abuse Father   . Heart attack Father   . Heart disease Father      Current Outpatient Medications on File Prior to Visit  Medication Sig Dispense Refill  . albuterol (PROVENTIL HFA;VENTOLIN HFA) 108 (90 BASE) MCG/ACT inhaler Inhale 1 puff into the lungs every 6 (six) hours as needed for shortness of breath.    Marland Kitchen albuterol (PROVENTIL) (2.5 MG/3ML) 0.083% nebulizer solution Take 2.5 mg by nebulization every 6 (six) hours as needed for shortness of breath.     Marland Kitchen alendronate (FOSAMAX) 70 MG tablet Take 70 mg by mouth once a week. Take with a full glass of water on an empty stomach.    Marland Kitchen atorvastatin (LIPITOR) 20 MG tablet Take 20 mg by mouth daily.    . baclofen (LIORESAL) 10 MG tablet Take 10 mg by mouth 3 (three) times daily as needed for muscle spasms.     . Cholecalciferol (VITAMIN D3) 2000 UNITS TABS Take 1 tablet by mouth daily.    . clonazePAM (KLONOPIN) 0.5 MG tablet Take 0.25-0.5 mg by mouth  2 (two) times daily as needed for anxiety.     . Digestive Enzymes (ACIDOLL) CAPS Take by mouth.    Marland Kitchen HYDROcodone-acetaminophen (NORCO/VICODIN) 5-325 MG per tablet Take 1 tablet by mouth every 6 (six) hours as needed for moderate pain.    . SYMBICORT 160-4.5 MCG/ACT inhaler Inhale 2 puffs into the lungs 2 (two) times daily.    Marland Kitchen venlafaxine XR (EFFEXOR-XR) 150 MG 24 hr capsule Take 300 mg by mouth daily.     Marland Kitchen omeprazole (PRILOSEC) 40 MG capsule Take 40 mg by mouth daily.    . TURMERIC PO Take 1 tablet by mouth daily.     No current facility-administered medications on file prior to visit.    Cardiovascular and other pertinent studies:  EKG 04/10/2019: Sinus rhythm 78 bpm. Occasional PAC    Nonspecific ST-T changes.  CT  Chest 07/29/2017: 1. Lung-RADS 2, benign appearance or behavior. Continue annual screening with low-dose chest CT without contrast in 12 months. 2. Aortic atherosclerosis (ICD10-I70.0), coronary artery atherosclerosis and emphysema (ICD10-J43.9).  Recent labs: 03/08/2019: Glucose 109, BUN/Cr 14/0.72. EGFR 80. Na/K 143/4.4. Rest of the CMP normal H/H 14/44. MCV 86. Platelets 209 HbA1C 5.7% Chol 166, TG 122, HDL 70, LDL 75   Review of Systems  Constitution: Negative for decreased appetite, malaise/fatigue, weight gain and weight loss.  HENT: Negative for congestion.   Eyes: Negative for visual disturbance.  Cardiovascular: Positive for dyspnea on exertion. Negative for chest pain, leg swelling, palpitations and syncope.  Respiratory: Positive for shortness of breath. Negative for cough.   Endocrine: Negative for cold intolerance.  Hematologic/Lymphatic: Does not bruise/bleed easily.  Skin: Negative for itching and rash.  Musculoskeletal: Positive for joint pain and myalgias.  Gastrointestinal: Negative for abdominal pain, nausea and vomiting.  Genitourinary: Negative for dysuria.  Neurological: Negative for dizziness and weakness.  Psychiatric/Behavioral: The patient is not nervous/anxious.   All other systems reviewed and are negative.        Vitals:   04/10/19 1356  BP: (!) 168/75  Pulse: 85  Temp: 97.8 F (36.6 C)  SpO2: 92%     Body mass index is 26.62 kg/m. Filed Weights   04/10/19 1356  Weight: 123 lb (55.8 kg)     Objective:   Physical Exam  Constitutional: She is oriented to person, place, and time. She appears well-developed and well-nourished. No distress.  HENT:  Head: Normocephalic and atraumatic.  Eyes: Pupils are equal, round, and reactive to light. Conjunctivae are normal.  Neck: No JVD present.  Cardiovascular: Normal rate, regular rhythm and intact distal pulses.  No murmur heard. Pulmonary/Chest: Effort normal and breath sounds normal. She  has no wheezes. She has no rales.  Abdominal: Soft. Bowel sounds are normal. There is no rebound.  Musculoskeletal:        General: No edema.  Lymphadenopathy:    She has no cervical adenopathy.  Neurological: She is alert and oriented to person, place, and time. No cranial nerve deficit.  Skin: Skin is warm and dry.  Psychiatric: She has a normal mood and affect.  Nursing note and vitals reviewed.       Assessment & Recommendations:   79 y.o. Caucasian female with hyperlipidemia, GERD, COPD, tobacco dependence, referred for management of coronary artery disease noted on CT chest.  Coronary atherosclerosis: Evident on CT chest. >  100-pack-year smoker with risk factors for CAD, including hypertension.  She does not have any symptoms of angina at this time.  Stable exertional dyspnea likely  related to COPD. Recommend medical management with Aspirin, statin. Started amlodipine 5 mg for blood pressure control. Will obtain echocardiogram.  Recommend stress testing, only if EF is reduced on echocardiogram, or if she has any recurrent angina symptoms.   Thank you for referring the patient to [place]. Please feel free to contact with any questions.  Nigel Mormon, MD [facility] Pager: [phone] Office: [phone]

## 2019-04-14 ENCOUNTER — Telehealth: Payer: Self-pay

## 2019-04-14 NOTE — Telephone Encounter (Signed)
Patient unsure she should start taking Amlodipine that was called in. I informed patient that per Dr. Virgina Jock office note she should be starting medication. She verbalized understanding.

## 2019-04-27 DIAGNOSIS — M47812 Spondylosis without myelopathy or radiculopathy, cervical region: Secondary | ICD-10-CM | POA: Diagnosis not present

## 2019-04-27 DIAGNOSIS — G894 Chronic pain syndrome: Secondary | ICD-10-CM | POA: Diagnosis not present

## 2019-04-27 DIAGNOSIS — M797 Fibromyalgia: Secondary | ICD-10-CM | POA: Diagnosis not present

## 2019-04-27 DIAGNOSIS — M545 Low back pain: Secondary | ICD-10-CM | POA: Diagnosis not present

## 2019-04-28 ENCOUNTER — Ambulatory Visit (INDEPENDENT_AMBULATORY_CARE_PROVIDER_SITE_OTHER): Payer: Medicare Other

## 2019-04-28 ENCOUNTER — Other Ambulatory Visit: Payer: Self-pay

## 2019-04-28 DIAGNOSIS — I2510 Atherosclerotic heart disease of native coronary artery without angina pectoris: Secondary | ICD-10-CM

## 2019-04-28 DIAGNOSIS — I10 Essential (primary) hypertension: Secondary | ICD-10-CM | POA: Diagnosis not present

## 2019-05-02 NOTE — Progress Notes (Signed)
Called pt to inform her about her echo results. Pr understood.

## 2019-05-30 DIAGNOSIS — M797 Fibromyalgia: Secondary | ICD-10-CM | POA: Diagnosis not present

## 2019-05-30 DIAGNOSIS — Z79899 Other long term (current) drug therapy: Secondary | ICD-10-CM | POA: Diagnosis not present

## 2019-05-30 DIAGNOSIS — M545 Low back pain: Secondary | ICD-10-CM | POA: Diagnosis not present

## 2019-05-30 DIAGNOSIS — Z79891 Long term (current) use of opiate analgesic: Secondary | ICD-10-CM | POA: Diagnosis not present

## 2019-05-30 DIAGNOSIS — M47812 Spondylosis without myelopathy or radiculopathy, cervical region: Secondary | ICD-10-CM | POA: Diagnosis not present

## 2019-05-30 DIAGNOSIS — G894 Chronic pain syndrome: Secondary | ICD-10-CM | POA: Diagnosis not present

## 2019-06-15 DIAGNOSIS — R300 Dysuria: Secondary | ICD-10-CM | POA: Diagnosis not present

## 2019-06-29 DIAGNOSIS — M797 Fibromyalgia: Secondary | ICD-10-CM | POA: Diagnosis not present

## 2019-06-29 DIAGNOSIS — M545 Low back pain: Secondary | ICD-10-CM | POA: Diagnosis not present

## 2019-06-29 DIAGNOSIS — M47812 Spondylosis without myelopathy or radiculopathy, cervical region: Secondary | ICD-10-CM | POA: Diagnosis not present

## 2019-06-29 DIAGNOSIS — G894 Chronic pain syndrome: Secondary | ICD-10-CM | POA: Diagnosis not present

## 2019-07-02 ENCOUNTER — Other Ambulatory Visit: Payer: Self-pay | Admitting: Cardiology

## 2019-07-02 DIAGNOSIS — I10 Essential (primary) hypertension: Secondary | ICD-10-CM

## 2019-07-17 ENCOUNTER — Other Ambulatory Visit: Payer: Self-pay

## 2019-07-17 DIAGNOSIS — I10 Essential (primary) hypertension: Secondary | ICD-10-CM

## 2019-07-17 MED ORDER — AMLODIPINE BESYLATE 5 MG PO TABS: ORAL_TABLET | ORAL | 1 refills | Status: DC

## 2019-07-25 DIAGNOSIS — G894 Chronic pain syndrome: Secondary | ICD-10-CM | POA: Diagnosis not present

## 2019-07-25 DIAGNOSIS — M797 Fibromyalgia: Secondary | ICD-10-CM | POA: Diagnosis not present

## 2019-07-25 DIAGNOSIS — M25519 Pain in unspecified shoulder: Secondary | ICD-10-CM | POA: Diagnosis not present

## 2019-07-25 DIAGNOSIS — M47812 Spondylosis without myelopathy or radiculopathy, cervical region: Secondary | ICD-10-CM | POA: Diagnosis not present

## 2019-08-25 DIAGNOSIS — M47812 Spondylosis without myelopathy or radiculopathy, cervical region: Secondary | ICD-10-CM | POA: Diagnosis not present

## 2019-08-25 DIAGNOSIS — G894 Chronic pain syndrome: Secondary | ICD-10-CM | POA: Diagnosis not present

## 2019-08-25 DIAGNOSIS — M25519 Pain in unspecified shoulder: Secondary | ICD-10-CM | POA: Diagnosis not present

## 2019-08-25 DIAGNOSIS — Z79891 Long term (current) use of opiate analgesic: Secondary | ICD-10-CM | POA: Diagnosis not present

## 2019-08-25 DIAGNOSIS — M797 Fibromyalgia: Secondary | ICD-10-CM | POA: Diagnosis not present

## 2019-08-25 DIAGNOSIS — Z79899 Other long term (current) drug therapy: Secondary | ICD-10-CM | POA: Diagnosis not present

## 2019-09-13 DIAGNOSIS — E559 Vitamin D deficiency, unspecified: Secondary | ICD-10-CM | POA: Diagnosis not present

## 2019-09-13 DIAGNOSIS — E7800 Pure hypercholesterolemia, unspecified: Secondary | ICD-10-CM | POA: Diagnosis not present

## 2019-09-18 DIAGNOSIS — E7800 Pure hypercholesterolemia, unspecified: Secondary | ICD-10-CM | POA: Diagnosis not present

## 2019-09-18 DIAGNOSIS — M810 Age-related osteoporosis without current pathological fracture: Secondary | ICD-10-CM | POA: Diagnosis not present

## 2019-09-18 DIAGNOSIS — R7301 Impaired fasting glucose: Secondary | ICD-10-CM | POA: Diagnosis not present

## 2019-09-18 DIAGNOSIS — E559 Vitamin D deficiency, unspecified: Secondary | ICD-10-CM | POA: Diagnosis not present

## 2019-09-19 DIAGNOSIS — M47812 Spondylosis without myelopathy or radiculopathy, cervical region: Secondary | ICD-10-CM | POA: Diagnosis not present

## 2019-09-19 DIAGNOSIS — G894 Chronic pain syndrome: Secondary | ICD-10-CM | POA: Diagnosis not present

## 2019-09-19 DIAGNOSIS — M797 Fibromyalgia: Secondary | ICD-10-CM | POA: Diagnosis not present

## 2019-09-19 DIAGNOSIS — M25519 Pain in unspecified shoulder: Secondary | ICD-10-CM | POA: Diagnosis not present

## 2019-09-27 DIAGNOSIS — M25512 Pain in left shoulder: Secondary | ICD-10-CM | POA: Diagnosis not present

## 2019-09-27 DIAGNOSIS — M19012 Primary osteoarthritis, left shoulder: Secondary | ICD-10-CM | POA: Diagnosis not present

## 2019-09-28 DIAGNOSIS — S99922A Unspecified injury of left foot, initial encounter: Secondary | ICD-10-CM | POA: Diagnosis not present

## 2019-09-28 DIAGNOSIS — W19XXXA Unspecified fall, initial encounter: Secondary | ICD-10-CM | POA: Diagnosis not present

## 2019-09-28 DIAGNOSIS — M25461 Effusion, right knee: Secondary | ICD-10-CM | POA: Diagnosis not present

## 2019-10-11 DIAGNOSIS — M47812 Spondylosis without myelopathy or radiculopathy, cervical region: Secondary | ICD-10-CM | POA: Diagnosis not present

## 2019-10-11 DIAGNOSIS — M4802 Spinal stenosis, cervical region: Secondary | ICD-10-CM | POA: Diagnosis not present

## 2019-10-11 DIAGNOSIS — M542 Cervicalgia: Secondary | ICD-10-CM | POA: Diagnosis not present

## 2019-10-12 ENCOUNTER — Ambulatory Visit: Payer: Medicare Other | Admitting: Podiatry

## 2019-10-12 ENCOUNTER — Ambulatory Visit: Payer: Medicare Other

## 2019-10-12 ENCOUNTER — Other Ambulatory Visit: Payer: Self-pay

## 2019-10-12 VITALS — Temp 96.5°F

## 2019-10-12 DIAGNOSIS — S9032XA Contusion of left foot, initial encounter: Secondary | ICD-10-CM | POA: Diagnosis not present

## 2019-10-12 DIAGNOSIS — S90122A Contusion of left lesser toe(s) without damage to nail, initial encounter: Secondary | ICD-10-CM

## 2019-10-12 DIAGNOSIS — L601 Onycholysis: Secondary | ICD-10-CM | POA: Diagnosis not present

## 2019-10-12 NOTE — Progress Notes (Signed)
  Subjective:  Patient ID: Elizabeth Velazquez, female    DOB: June 11, 1940,  MRN: [id number]  Chief Complaint  Patient presents with  . Nail Problem    L hallux - nail injury. Pt stated, "I fell out of bed on 09/28/19. The nail pulled back. I went to urgent care, and they stuck it with a needle 4-5 times. The doctor finally numbed it and stitched the nail to the nail bed, saying that it might reattach itself. I have some pain - hard to say because I just had a shoulder injection with pain meds". No drainage".    79 y.o. female presents with the above complaint. History confirmed with patient.   Objective:  Physical Exam: warm, good capillary refill, no trophic changes or ulcerative lesions, normal DP and PT pulses and normal sensory exam. Left Foot: onyhcolysis left hallux with nail sutured to the medial nail fold. No nail bed injury. No warmth, erythema, of the digit.  Right Foot: normal exam, no swelling, tenderness, instability; ligaments intact, full range of motion of all ankle/foot joints   Assessment:   1. Contusion of left foot including toes, initial encounter   2. Onycholysis    Plan:  Patient was evaluated and treated and all questions answered.  Onycholysis -Nail suture cut, remaining nail excised with scissors. Nail bed healing well. Advised to soak as needed, advised of signs of worsening changes. ABx ointment and band-aid applied. -F/u PRN  No follow-ups on file.

## 2019-10-17 DIAGNOSIS — M47812 Spondylosis without myelopathy or radiculopathy, cervical region: Secondary | ICD-10-CM | POA: Diagnosis not present

## 2019-10-17 DIAGNOSIS — G894 Chronic pain syndrome: Secondary | ICD-10-CM | POA: Diagnosis not present

## 2019-10-17 DIAGNOSIS — M25519 Pain in unspecified shoulder: Secondary | ICD-10-CM | POA: Diagnosis not present

## 2019-10-17 DIAGNOSIS — M797 Fibromyalgia: Secondary | ICD-10-CM | POA: Diagnosis not present

## 2019-11-15 DIAGNOSIS — Z79899 Other long term (current) drug therapy: Secondary | ICD-10-CM | POA: Diagnosis not present

## 2019-11-15 DIAGNOSIS — M47812 Spondylosis without myelopathy or radiculopathy, cervical region: Secondary | ICD-10-CM | POA: Diagnosis not present

## 2019-11-15 DIAGNOSIS — M797 Fibromyalgia: Secondary | ICD-10-CM | POA: Diagnosis not present

## 2019-11-15 DIAGNOSIS — M25519 Pain in unspecified shoulder: Secondary | ICD-10-CM | POA: Diagnosis not present

## 2019-11-15 DIAGNOSIS — Z79891 Long term (current) use of opiate analgesic: Secondary | ICD-10-CM | POA: Diagnosis not present

## 2019-11-15 DIAGNOSIS — G894 Chronic pain syndrome: Secondary | ICD-10-CM | POA: Diagnosis not present

## 2019-11-20 ENCOUNTER — Other Ambulatory Visit: Payer: Self-pay

## 2019-11-20 ENCOUNTER — Emergency Department (HOSPITAL_COMMUNITY)
Admission: EM | Admit: 2019-11-20 | Discharge: 2019-11-21 | Disposition: A | Discharge status: Home / Self Care | Payer: Medicare Other | Attending: Emergency Medicine | Admitting: Emergency Medicine

## 2019-11-20 ENCOUNTER — Encounter (HOSPITAL_COMMUNITY): Payer: Self-pay | Admitting: Emergency Medicine

## 2019-11-20 DIAGNOSIS — Z5321 Procedure and treatment not carried out due to patient leaving prior to being seen by health care provider: Secondary | ICD-10-CM | POA: Insufficient documentation

## 2019-11-20 DIAGNOSIS — R1030 Lower abdominal pain, unspecified: Secondary | ICD-10-CM | POA: Insufficient documentation

## 2019-11-20 DIAGNOSIS — R300 Dysuria: Secondary | ICD-10-CM | POA: Diagnosis not present

## 2019-11-20 LAB — URINALYSIS, ROUTINE W REFLEX MICROSCOPIC
Bilirubin Urine: NEGATIVE
Glucose, UA: NEGATIVE mg/dL
Hgb urine dipstick: NEGATIVE
Ketones, ur: NEGATIVE mg/dL
Nitrite: NEGATIVE
Protein, ur: NEGATIVE mg/dL
Specific Gravity, Urine: 1.012 (ref 1.005–1.030)
pH: 5 (ref 5.0–8.0)

## 2019-11-20 LAB — COMPREHENSIVE METABOLIC PANEL
ALT: 16 U/L (ref 0–44)
AST: 23 U/L (ref 15–41)
Albumin: 4.3 g/dL (ref 3.5–5.0)
Alkaline Phosphatase: 67 U/L (ref 38–126)
Anion gap: 7 (ref 5–15)
BUN: 16 mg/dL (ref 8–23)
CO2: 29 mmol/L (ref 22–32)
Calcium: 8.5 mg/dL — ABNORMAL LOW (ref 8.9–10.3)
Chloride: 103 mmol/L (ref 98–111)
Creatinine, Ser: 0.55 mg/dL (ref 0.44–1.00)
GFR calc Af Amer: >60 mL/min (ref >60)
GFR calc non Af Amer: >60 mL/min (ref >60)
Glucose, Bld: 128 mg/dL — ABNORMAL HIGH (ref 70–99)
Potassium: 4 mmol/L (ref 3.5–5.1)
Sodium: 139 mmol/L (ref 135–145)
Total Bilirubin: 0.4 mg/dL (ref 0.3–1.2)
Total Protein: 6.7 g/dL (ref 6.5–8.1)

## 2019-11-20 LAB — CBC
HCT: 43.3 % (ref 36.0–46.0)
Hemoglobin: 14.1 g/dL (ref 12.0–15.0)
MCH: 28.7 pg (ref 26.0–34.0)
MCHC: 32.6 g/dL (ref 30.0–36.0)
MCV: 88.2 fL (ref 80.0–100.0)
Platelets: 245 10*3/uL (ref 150–400)
RBC: 4.91 MIL/uL (ref 3.87–5.11)
RDW: 13.8 % (ref 11.5–15.5)
WBC: 6.4 10*3/uL (ref 4.0–10.5)
nRBC: 0 % (ref 0.0–0.2)

## 2019-11-20 LAB — LIPASE, BLOOD: Lipase: 27 U/L (ref 11–51)

## 2019-11-20 NOTE — ED Triage Notes (Signed)
Pt c/o lower abd pain  that radiates around to bilat flank area for over 2 weeks. Pt sent to rule out bladder/kidney infection. C/o dysuria as well

## 2019-11-21 NOTE — ED Notes (Signed)
Pt eloped from waiting area prior to room placement. Called 3X.

## 2019-11-22 DIAGNOSIS — N390 Urinary tract infection, site not specified: Secondary | ICD-10-CM | POA: Diagnosis not present

## 2019-12-13 DIAGNOSIS — M47812 Spondylosis without myelopathy or radiculopathy, cervical region: Secondary | ICD-10-CM | POA: Diagnosis not present

## 2019-12-13 DIAGNOSIS — Z79899 Other long term (current) drug therapy: Secondary | ICD-10-CM | POA: Diagnosis not present

## 2019-12-13 DIAGNOSIS — M25519 Pain in unspecified shoulder: Secondary | ICD-10-CM | POA: Diagnosis not present

## 2019-12-13 DIAGNOSIS — M797 Fibromyalgia: Secondary | ICD-10-CM | POA: Diagnosis not present

## 2019-12-13 DIAGNOSIS — G894 Chronic pain syndrome: Secondary | ICD-10-CM | POA: Diagnosis not present

## 2019-12-13 DIAGNOSIS — Z79891 Long term (current) use of opiate analgesic: Secondary | ICD-10-CM | POA: Diagnosis not present

## 2020-01-01 ENCOUNTER — Other Ambulatory Visit: Payer: Self-pay | Admitting: Cardiology

## 2020-01-01 DIAGNOSIS — I10 Essential (primary) hypertension: Secondary | ICD-10-CM

## 2020-01-11 DIAGNOSIS — M797 Fibromyalgia: Secondary | ICD-10-CM | POA: Diagnosis not present

## 2020-01-11 DIAGNOSIS — M47812 Spondylosis without myelopathy or radiculopathy, cervical region: Secondary | ICD-10-CM | POA: Diagnosis not present

## 2020-01-11 DIAGNOSIS — G894 Chronic pain syndrome: Secondary | ICD-10-CM | POA: Diagnosis not present

## 2020-01-11 DIAGNOSIS — M25519 Pain in unspecified shoulder: Secondary | ICD-10-CM | POA: Diagnosis not present

## 2020-01-19 DIAGNOSIS — Z23 Encounter for immunization: Secondary | ICD-10-CM | POA: Diagnosis not present

## 2020-01-30 DIAGNOSIS — J449 Chronic obstructive pulmonary disease, unspecified: Secondary | ICD-10-CM | POA: Diagnosis not present

## 2020-02-08 DIAGNOSIS — M5459 Other low back pain: Secondary | ICD-10-CM | POA: Diagnosis not present

## 2020-02-08 DIAGNOSIS — G894 Chronic pain syndrome: Secondary | ICD-10-CM | POA: Diagnosis not present

## 2020-02-08 DIAGNOSIS — M797 Fibromyalgia: Secondary | ICD-10-CM | POA: Diagnosis not present

## 2020-02-08 DIAGNOSIS — M47812 Spondylosis without myelopathy or radiculopathy, cervical region: Secondary | ICD-10-CM | POA: Diagnosis not present

## 2020-02-08 DIAGNOSIS — Z79891 Long term (current) use of opiate analgesic: Secondary | ICD-10-CM | POA: Diagnosis not present

## 2020-02-08 DIAGNOSIS — Z79899 Other long term (current) drug therapy: Secondary | ICD-10-CM | POA: Diagnosis not present

## 2020-03-12 DIAGNOSIS — J449 Chronic obstructive pulmonary disease, unspecified: Secondary | ICD-10-CM | POA: Diagnosis not present

## 2020-03-13 DIAGNOSIS — M25512 Pain in left shoulder: Secondary | ICD-10-CM | POA: Diagnosis not present

## 2020-03-18 DIAGNOSIS — E559 Vitamin D deficiency, unspecified: Secondary | ICD-10-CM | POA: Diagnosis not present

## 2020-03-18 DIAGNOSIS — R7303 Prediabetes: Secondary | ICD-10-CM | POA: Diagnosis not present

## 2020-03-18 DIAGNOSIS — M797 Fibromyalgia: Secondary | ICD-10-CM | POA: Diagnosis not present

## 2020-03-18 DIAGNOSIS — E7800 Pure hypercholesterolemia, unspecified: Secondary | ICD-10-CM | POA: Diagnosis not present

## 2020-03-18 DIAGNOSIS — M810 Age-related osteoporosis without current pathological fracture: Secondary | ICD-10-CM | POA: Diagnosis not present

## 2020-03-21 ENCOUNTER — Other Ambulatory Visit: Payer: Self-pay | Admitting: Specialist

## 2020-03-21 DIAGNOSIS — J449 Chronic obstructive pulmonary disease, unspecified: Secondary | ICD-10-CM | POA: Diagnosis not present

## 2020-03-21 DIAGNOSIS — E7800 Pure hypercholesterolemia, unspecified: Secondary | ICD-10-CM | POA: Diagnosis not present

## 2020-03-21 DIAGNOSIS — Z0000 Encounter for general adult medical examination without abnormal findings: Secondary | ICD-10-CM | POA: Diagnosis not present

## 2020-03-21 DIAGNOSIS — K219 Gastro-esophageal reflux disease without esophagitis: Secondary | ICD-10-CM | POA: Diagnosis not present

## 2020-04-05 DIAGNOSIS — M5417 Radiculopathy, lumbosacral region: Secondary | ICD-10-CM | POA: Diagnosis not present

## 2020-04-09 DIAGNOSIS — Z79891 Long term (current) use of opiate analgesic: Secondary | ICD-10-CM | POA: Diagnosis not present

## 2020-04-09 DIAGNOSIS — G894 Chronic pain syndrome: Secondary | ICD-10-CM | POA: Diagnosis not present

## 2020-04-09 DIAGNOSIS — Z79899 Other long term (current) drug therapy: Secondary | ICD-10-CM | POA: Diagnosis not present

## 2020-04-09 DIAGNOSIS — M549 Dorsalgia, unspecified: Secondary | ICD-10-CM | POA: Diagnosis not present

## 2020-04-09 DIAGNOSIS — M47812 Spondylosis without myelopathy or radiculopathy, cervical region: Secondary | ICD-10-CM | POA: Diagnosis not present

## 2020-04-09 DIAGNOSIS — M797 Fibromyalgia: Secondary | ICD-10-CM | POA: Diagnosis not present

## 2020-04-09 DIAGNOSIS — M5459 Other low back pain: Secondary | ICD-10-CM | POA: Diagnosis not present

## 2020-04-11 DIAGNOSIS — G8929 Other chronic pain: Secondary | ICD-10-CM | POA: Diagnosis not present

## 2020-04-11 DIAGNOSIS — M545 Low back pain, unspecified: Secondary | ICD-10-CM | POA: Diagnosis not present

## 2020-05-07 DIAGNOSIS — G894 Chronic pain syndrome: Secondary | ICD-10-CM | POA: Diagnosis not present

## 2020-05-07 DIAGNOSIS — M5459 Other low back pain: Secondary | ICD-10-CM | POA: Diagnosis not present

## 2020-05-07 DIAGNOSIS — M797 Fibromyalgia: Secondary | ICD-10-CM | POA: Diagnosis not present

## 2020-05-07 DIAGNOSIS — M47812 Spondylosis without myelopathy or radiculopathy, cervical region: Secondary | ICD-10-CM | POA: Diagnosis not present

## 2020-06-04 DIAGNOSIS — M25519 Pain in unspecified shoulder: Secondary | ICD-10-CM | POA: Diagnosis not present

## 2020-06-04 DIAGNOSIS — G894 Chronic pain syndrome: Secondary | ICD-10-CM | POA: Diagnosis not present

## 2020-06-04 DIAGNOSIS — M5459 Other low back pain: Secondary | ICD-10-CM | POA: Diagnosis not present

## 2020-06-04 DIAGNOSIS — M797 Fibromyalgia: Secondary | ICD-10-CM | POA: Diagnosis not present

## 2020-06-26 ENCOUNTER — Other Ambulatory Visit: Payer: Self-pay | Admitting: Cardiology

## 2020-06-26 DIAGNOSIS — I10 Essential (primary) hypertension: Secondary | ICD-10-CM

## 2020-07-02 DIAGNOSIS — G894 Chronic pain syndrome: Secondary | ICD-10-CM | POA: Diagnosis not present

## 2020-07-02 DIAGNOSIS — M797 Fibromyalgia: Secondary | ICD-10-CM | POA: Diagnosis not present

## 2020-07-02 DIAGNOSIS — M47812 Spondylosis without myelopathy or radiculopathy, cervical region: Secondary | ICD-10-CM | POA: Diagnosis not present

## 2020-07-02 DIAGNOSIS — M5459 Other low back pain: Secondary | ICD-10-CM | POA: Diagnosis not present

## 2020-07-30 DIAGNOSIS — M797 Fibromyalgia: Secondary | ICD-10-CM | POA: Diagnosis not present

## 2020-07-30 DIAGNOSIS — Z79899 Other long term (current) drug therapy: Secondary | ICD-10-CM | POA: Diagnosis not present

## 2020-07-30 DIAGNOSIS — G894 Chronic pain syndrome: Secondary | ICD-10-CM | POA: Diagnosis not present

## 2020-07-30 DIAGNOSIS — M47816 Spondylosis without myelopathy or radiculopathy, lumbar region: Secondary | ICD-10-CM | POA: Diagnosis not present

## 2020-07-30 DIAGNOSIS — M47812 Spondylosis without myelopathy or radiculopathy, cervical region: Secondary | ICD-10-CM | POA: Diagnosis not present

## 2020-07-30 DIAGNOSIS — Z79891 Long term (current) use of opiate analgesic: Secondary | ICD-10-CM | POA: Diagnosis not present

## 2020-08-12 ENCOUNTER — Other Ambulatory Visit: Payer: Self-pay | Admitting: Physician Assistant

## 2020-08-12 DIAGNOSIS — M5416 Radiculopathy, lumbar region: Secondary | ICD-10-CM

## 2020-08-16 ENCOUNTER — Other Ambulatory Visit: Payer: Self-pay | Admitting: Physician Assistant

## 2020-08-16 DIAGNOSIS — M5416 Radiculopathy, lumbar region: Secondary | ICD-10-CM

## 2020-08-21 DIAGNOSIS — M5442 Lumbago with sciatica, left side: Secondary | ICD-10-CM | POA: Diagnosis not present

## 2020-08-21 DIAGNOSIS — G8929 Other chronic pain: Secondary | ICD-10-CM | POA: Diagnosis not present

## 2020-08-21 DIAGNOSIS — M5441 Lumbago with sciatica, right side: Secondary | ICD-10-CM | POA: Diagnosis not present

## 2020-08-21 DIAGNOSIS — M542 Cervicalgia: Secondary | ICD-10-CM | POA: Diagnosis not present

## 2020-08-27 DIAGNOSIS — G894 Chronic pain syndrome: Secondary | ICD-10-CM | POA: Diagnosis not present

## 2020-08-27 DIAGNOSIS — M797 Fibromyalgia: Secondary | ICD-10-CM | POA: Diagnosis not present

## 2020-08-27 DIAGNOSIS — M47816 Spondylosis without myelopathy or radiculopathy, lumbar region: Secondary | ICD-10-CM | POA: Diagnosis not present

## 2020-08-27 DIAGNOSIS — M47812 Spondylosis without myelopathy or radiculopathy, cervical region: Secondary | ICD-10-CM | POA: Diagnosis not present

## 2020-08-28 ENCOUNTER — Other Ambulatory Visit: Payer: Medicare Other

## 2020-09-16 DIAGNOSIS — Z0000 Encounter for general adult medical examination without abnormal findings: Secondary | ICD-10-CM | POA: Diagnosis not present

## 2020-09-16 DIAGNOSIS — M2550 Pain in unspecified joint: Secondary | ICD-10-CM | POA: Diagnosis not present

## 2020-09-19 DIAGNOSIS — E7800 Pure hypercholesterolemia, unspecified: Secondary | ICD-10-CM | POA: Diagnosis not present

## 2020-09-19 DIAGNOSIS — I25111 Atherosclerotic heart disease of native coronary artery with angina pectoris with documented spasm: Secondary | ICD-10-CM | POA: Diagnosis not present

## 2020-09-19 DIAGNOSIS — I10 Essential (primary) hypertension: Secondary | ICD-10-CM | POA: Diagnosis not present

## 2020-09-19 DIAGNOSIS — E559 Vitamin D deficiency, unspecified: Secondary | ICD-10-CM | POA: Diagnosis not present

## 2020-09-25 DIAGNOSIS — Z79891 Long term (current) use of opiate analgesic: Secondary | ICD-10-CM | POA: Diagnosis not present

## 2020-09-25 DIAGNOSIS — M797 Fibromyalgia: Secondary | ICD-10-CM | POA: Diagnosis not present

## 2020-09-25 DIAGNOSIS — M7918 Myalgia, other site: Secondary | ICD-10-CM | POA: Diagnosis not present

## 2020-09-25 DIAGNOSIS — G894 Chronic pain syndrome: Secondary | ICD-10-CM | POA: Diagnosis not present

## 2020-09-25 DIAGNOSIS — Z79899 Other long term (current) drug therapy: Secondary | ICD-10-CM | POA: Diagnosis not present

## 2020-09-25 DIAGNOSIS — M47816 Spondylosis without myelopathy or radiculopathy, lumbar region: Secondary | ICD-10-CM | POA: Diagnosis not present

## 2020-10-29 DIAGNOSIS — M47816 Spondylosis without myelopathy or radiculopathy, lumbar region: Secondary | ICD-10-CM | POA: Diagnosis not present

## 2020-10-29 DIAGNOSIS — R300 Dysuria: Secondary | ICD-10-CM | POA: Diagnosis not present

## 2020-10-29 DIAGNOSIS — M7918 Myalgia, other site: Secondary | ICD-10-CM | POA: Diagnosis not present

## 2020-10-29 DIAGNOSIS — M797 Fibromyalgia: Secondary | ICD-10-CM | POA: Diagnosis not present

## 2020-10-29 DIAGNOSIS — R52 Pain, unspecified: Secondary | ICD-10-CM | POA: Diagnosis not present

## 2020-10-29 DIAGNOSIS — G894 Chronic pain syndrome: Secondary | ICD-10-CM | POA: Diagnosis not present

## 2020-10-29 DIAGNOSIS — M47812 Spondylosis without myelopathy or radiculopathy, cervical region: Secondary | ICD-10-CM | POA: Diagnosis not present

## 2020-11-14 DIAGNOSIS — R300 Dysuria: Secondary | ICD-10-CM | POA: Diagnosis not present

## 2020-11-14 DIAGNOSIS — K219 Gastro-esophageal reflux disease without esophagitis: Secondary | ICD-10-CM | POA: Diagnosis not present

## 2020-11-25 DIAGNOSIS — M797 Fibromyalgia: Secondary | ICD-10-CM | POA: Diagnosis not present

## 2020-11-25 DIAGNOSIS — G894 Chronic pain syndrome: Secondary | ICD-10-CM | POA: Diagnosis not present

## 2020-11-25 DIAGNOSIS — M47816 Spondylosis without myelopathy or radiculopathy, lumbar region: Secondary | ICD-10-CM | POA: Diagnosis not present

## 2020-11-25 DIAGNOSIS — M47812 Spondylosis without myelopathy or radiculopathy, cervical region: Secondary | ICD-10-CM | POA: Diagnosis not present

## 2020-11-28 DIAGNOSIS — F5101 Primary insomnia: Secondary | ICD-10-CM | POA: Diagnosis not present

## 2020-11-28 DIAGNOSIS — N3000 Acute cystitis without hematuria: Secondary | ICD-10-CM | POA: Diagnosis not present

## 2020-11-28 DIAGNOSIS — K219 Gastro-esophageal reflux disease without esophagitis: Secondary | ICD-10-CM | POA: Diagnosis not present

## 2020-12-04 DIAGNOSIS — I10 Essential (primary) hypertension: Secondary | ICD-10-CM | POA: Diagnosis not present

## 2020-12-04 DIAGNOSIS — K219 Gastro-esophageal reflux disease without esophagitis: Secondary | ICD-10-CM | POA: Diagnosis not present

## 2020-12-04 DIAGNOSIS — E7800 Pure hypercholesterolemia, unspecified: Secondary | ICD-10-CM | POA: Diagnosis not present

## 2020-12-04 DIAGNOSIS — M810 Age-related osteoporosis without current pathological fracture: Secondary | ICD-10-CM | POA: Diagnosis not present

## 2020-12-22 ENCOUNTER — Other Ambulatory Visit: Payer: Self-pay | Admitting: Cardiology

## 2020-12-22 DIAGNOSIS — I10 Essential (primary) hypertension: Secondary | ICD-10-CM

## 2020-12-23 DIAGNOSIS — G894 Chronic pain syndrome: Secondary | ICD-10-CM | POA: Diagnosis not present

## 2020-12-23 DIAGNOSIS — M797 Fibromyalgia: Secondary | ICD-10-CM | POA: Diagnosis not present

## 2020-12-23 DIAGNOSIS — M47812 Spondylosis without myelopathy or radiculopathy, cervical region: Secondary | ICD-10-CM | POA: Diagnosis not present

## 2020-12-23 DIAGNOSIS — M47816 Spondylosis without myelopathy or radiculopathy, lumbar region: Secondary | ICD-10-CM | POA: Diagnosis not present

## 2020-12-26 DIAGNOSIS — N3000 Acute cystitis without hematuria: Secondary | ICD-10-CM | POA: Diagnosis not present

## 2020-12-26 DIAGNOSIS — F5101 Primary insomnia: Secondary | ICD-10-CM | POA: Diagnosis not present

## 2021-01-02 DIAGNOSIS — N390 Urinary tract infection, site not specified: Secondary | ICD-10-CM | POA: Diagnosis not present

## 2021-01-02 DIAGNOSIS — F5101 Primary insomnia: Secondary | ICD-10-CM | POA: Diagnosis not present

## 2021-01-07 DIAGNOSIS — J449 Chronic obstructive pulmonary disease, unspecified: Secondary | ICD-10-CM | POA: Diagnosis not present

## 2021-01-23 DIAGNOSIS — M47812 Spondylosis without myelopathy or radiculopathy, cervical region: Secondary | ICD-10-CM | POA: Diagnosis not present

## 2021-01-23 DIAGNOSIS — G894 Chronic pain syndrome: Secondary | ICD-10-CM | POA: Diagnosis not present

## 2021-01-23 DIAGNOSIS — M797 Fibromyalgia: Secondary | ICD-10-CM | POA: Diagnosis not present

## 2021-01-23 DIAGNOSIS — M47816 Spondylosis without myelopathy or radiculopathy, lumbar region: Secondary | ICD-10-CM | POA: Diagnosis not present

## 2021-01-24 DIAGNOSIS — Z23 Encounter for immunization: Secondary | ICD-10-CM | POA: Diagnosis not present

## 2021-01-30 DIAGNOSIS — M542 Cervicalgia: Secondary | ICD-10-CM | POA: Diagnosis not present

## 2021-01-30 DIAGNOSIS — K5901 Slow transit constipation: Secondary | ICD-10-CM | POA: Diagnosis not present

## 2021-02-12 DIAGNOSIS — M19012 Primary osteoarthritis, left shoulder: Secondary | ICD-10-CM | POA: Diagnosis not present

## 2021-02-25 DIAGNOSIS — M797 Fibromyalgia: Secondary | ICD-10-CM | POA: Diagnosis not present

## 2021-02-25 DIAGNOSIS — Z79891 Long term (current) use of opiate analgesic: Secondary | ICD-10-CM | POA: Diagnosis not present

## 2021-02-25 DIAGNOSIS — G894 Chronic pain syndrome: Secondary | ICD-10-CM | POA: Diagnosis not present

## 2021-02-25 DIAGNOSIS — M47812 Spondylosis without myelopathy or radiculopathy, cervical region: Secondary | ICD-10-CM | POA: Diagnosis not present

## 2021-02-25 DIAGNOSIS — M47816 Spondylosis without myelopathy or radiculopathy, lumbar region: Secondary | ICD-10-CM | POA: Diagnosis not present

## 2021-02-25 DIAGNOSIS — Z79899 Other long term (current) drug therapy: Secondary | ICD-10-CM | POA: Diagnosis not present

## 2021-03-11 ENCOUNTER — Institutional Professional Consult (permissible substitution): Payer: Medicare Other | Admitting: Internal Medicine

## 2021-03-12 ENCOUNTER — Institutional Professional Consult (permissible substitution): Payer: Medicare Other | Admitting: Internal Medicine

## 2021-03-12 DIAGNOSIS — J449 Chronic obstructive pulmonary disease, unspecified: Secondary | ICD-10-CM | POA: Diagnosis not present

## 2021-03-13 ENCOUNTER — Ambulatory Visit: Payer: Medicare Other | Admitting: Pulmonary Disease

## 2021-03-13 ENCOUNTER — Other Ambulatory Visit: Payer: Self-pay

## 2021-03-13 ENCOUNTER — Encounter: Payer: Self-pay | Admitting: Pulmonary Disease

## 2021-03-13 VITALS — BP 130/72 | HR 91 | Ht <= 58 in | Wt 134.0 lb

## 2021-03-13 DIAGNOSIS — J441 Chronic obstructive pulmonary disease with (acute) exacerbation: Secondary | ICD-10-CM

## 2021-03-13 DIAGNOSIS — J432 Centrilobular emphysema: Secondary | ICD-10-CM | POA: Diagnosis not present

## 2021-03-13 DIAGNOSIS — R490 Dysphonia: Secondary | ICD-10-CM | POA: Diagnosis not present

## 2021-03-13 MED ORDER — AZITHROMYCIN 250 MG PO TABS: ORAL_TABLET | ORAL | 0 refills | Status: DC

## 2021-03-13 MED ORDER — SPIRIVA RESPIMAT 2.5 MCG/ACT IN AERS: 2.0000 | INHALATION_SPRAY | Freq: Every day | RESPIRATORY_TRACT | 0 refills | Status: DC

## 2021-03-13 MED ORDER — PREDNISONE 10 MG PO TABS
40.0000 mg | ORAL_TABLET | Freq: Every day | ORAL | 0 refills | Status: AC
Start: 2021-03-13 — End: 2021-03-18

## 2021-03-13 NOTE — Progress Notes (Signed)
Synopsis: Referred in December 2022 for COPD by Deland Pretty, MD  Subjective:   PATIENT ID: Elizabeth Velazquez GENDER: female DOB: Dec 26, 1940, MRN: [id number]  HPI  Chief Complaint  Patient presents with   Consult    Referred by PCP for COPD. States she was diagnosed about 2 years. Has noticed an increase in wheezing and SOB. Uses 3L of O2 at night.    Elizabeth Velazquez is an 80 year old woman, daily smoker with GERD who is referred to pulmonary clinic for evaluation of COPD.   Patient reports hoarseness and loss of her voice a few months ago.  She also has significant shortness of breath especially with exertion.  She does have intermittent wheezing and daily cough with sputum production.  She does have a nebulizer machine at home and is not using it much.  She is using Symbicort 160-4.5 MCG 1 puff in the morning and another puff around midday.  She is smoking 1 to 2 packs/day and has been smoking for 65 years.  She had a CT chest lung cancer screening scan in 2019 which showed moderate bullous type emphysema and lower lobe predominant bronchial thickening.  Past Medical History:  Diagnosis Date   Anxiety    Arthritis    COPD (chronic obstructive pulmonary disease) (HCC)    Dysphagia    GERD (gastroesophageal reflux disease)    History of hiatal hernia    Hyperlipidemia    Shoulder fracture, left    Skin cancer    Wears dentures    Wears glasses      Family History  Problem Relation Age of Onset   Stroke Mother    Alcohol abuse Father    Heart attack Father    Heart disease Father      Social History   Socioeconomic History   Marital status: Widowed    Spouse name: Not on file   Number of children: 2   Years of education: Not on file   Highest education level: Not on file  Occupational History   Not on file  Tobacco Use   Smoking status: Every Day    Packs/day: 2.00    Years: 65.00    Pack years: 130.00    Types: Cigarettes   Smokeless tobacco: Never  Vaping Use    Vaping Use: Never used  Substance and Sexual Activity   Alcohol use: No   Drug use: No   Sexual activity: Not on file  Other Topics Concern   Not on file  Social History Narrative   Not on file   Social Determinants of Health   Financial Resource Strain: Not on file  Food Insecurity: Not on file  Transportation Needs: Not on file  Physical Activity: Not on file  Stress: Not on file  Social Connections: Not on file  Intimate Partner Violence: Not on file     Allergies  Allergen Reactions   Penicillins     Did it involve swelling of the face/tongue/throat, SOB, or low BP? Unknown Did it involve sudden or severe rash/hives, skin peeling, or any reaction on the inside of your mouth or nose? Unknown Did you need to seek medical attention at a hospital or doctor's office? Unknown When did it last happen?      childhood If all above answers are "NO", may proceed with cephalosporin use.      Outpatient Medications Prior to Visit  Medication Sig Dispense Refill   albuterol (PROVENTIL HFA;VENTOLIN HFA) 108 (90 BASE) MCG/ACT  inhaler Inhale 1 puff into the lungs every 6 (six) hours as needed for shortness of breath.     albuterol (PROVENTIL) (2.5 MG/3ML) 0.083% nebulizer solution Take 2.5 mg by nebulization every 6 (six) hours as needed for shortness of breath.      alendronate (FOSAMAX) 70 MG tablet Take 70 mg by mouth once a week. Take with a full glass of water on an empty stomach.     amLODipine (NORVASC) 5 MG tablet TAKE 1 TABLET(5 MG) BY MOUTH DAILY 90 tablet 1   amLODipine (NORVASC) 5 MG tablet 1 tablet     aspirin EC 81 MG tablet Take 1 tablet (81 mg total) by mouth daily. 90 tablet 3   atorvastatin (LIPITOR) 20 MG tablet Take 20 mg by mouth daily.     atorvastatin (LIPITOR) 20 MG tablet Take 1 tablet by mouth daily.     baclofen (LIORESAL) 10 MG tablet Take 10 mg by mouth 3 (three) times daily as needed for muscle spasms.      Cholecalciferol (VITAMIN D3) 125 MCG (5000 UT)  CAPS 1 capsule     Cholecalciferol (VITAMIN D3) 2000 UNITS TABS Take 1 tablet by mouth daily.     clonazePAM (KLONOPIN) 0.5 MG tablet Take 0.25-0.5 mg by mouth 2 (two) times daily as needed for anxiety.      Digestive Enzymes (ACIDOLL) CAPS Take by mouth.     gabapentin (NEURONTIN) 400 MG capsule Take 400 mg by mouth 3 (three) times daily.     HYDROcodone Bitartrate ER 40 MG T24A Hysingla ER 40 mg tablet, crush resistant, extended release  TAKE 1 TABLET BY MOUTH ONCE A DAY     HYDROcodone-acetaminophen (NORCO/VICODIN) 5-325 MG per tablet Take 1 tablet by mouth every 6 (six) hours as needed for moderate pain.     omeprazole (PRILOSEC) 40 MG capsule Take 40 mg by mouth daily.     omeprazole (PRILOSEC) 40 MG capsule take 1 capsule by mouth every day 30 minutes before breakfast     SYMBICORT 160-4.5 MCG/ACT inhaler Inhale 2 puffs into the lungs 2 (two) times daily.     TURMERIC PO Take 1 tablet by mouth daily.     venlafaxine XR (EFFEXOR-XR) 150 MG 24 hr capsule Take 300 mg by mouth daily.      zolpidem (AMBIEN) 5 MG tablet 1 tablet at bedtime.     No facility-administered medications prior to visit.   Review of Systems  Constitutional:  Negative for chills, fever, malaise/fatigue and weight loss.  HENT:  Negative for congestion, sinus pain and sore throat.        Hoarseness of voice  Eyes: Negative.   Respiratory:  Positive for cough, sputum production, shortness of breath and wheezing. Negative for hemoptysis.   Cardiovascular:  Negative for chest pain, palpitations, orthopnea, claudication and leg swelling.  Gastrointestinal:  Negative for abdominal pain, heartburn, nausea and vomiting.  Genitourinary: Negative.   Musculoskeletal:  Negative for joint pain and myalgias.  Skin:  Negative for rash.  Neurological:  Negative for weakness.  Endo/Heme/Allergies: Negative.   Psychiatric/Behavioral: Negative.     Objective:   Vitals:   03/13/21 1346  BP: 130/72  Pulse: 91  SpO2: 95%   Weight: 134 lb (60.8 kg)  Height: 4\' 9"  (1.448 m)    Physical Exam Constitutional:      General: She is not in acute distress.    Appearance: She is not ill-appearing.  HENT:     Head: Normocephalic and atraumatic.  Eyes:     General: No scleral icterus.    Conjunctiva/sclera: Conjunctivae normal.     Pupils: Pupils are equal, round, and reactive to light.  Cardiovascular:     Rate and Rhythm: Normal rate and regular rhythm.     Pulses: Normal pulses.     Heart sounds: Normal heart sounds. No murmur heard. Pulmonary:     Effort: Pulmonary effort is normal.     Breath sounds: Decreased air movement present. Decreased breath sounds and wheezing (right upper lung field) present. No rhonchi or rales.  Abdominal:     General: Bowel sounds are normal.     Palpations: Abdomen is soft.  Musculoskeletal:     Right lower leg: No edema.     Left lower leg: No edema.  Lymphadenopathy:     Cervical: No cervical adenopathy.  Skin:    General: Skin is warm and dry.  Neurological:     General: No focal deficit present.     Mental Status: She is alert.  Psychiatric:        Mood and Affect: Mood normal.        Behavior: Behavior normal.        Thought Content: Thought content normal.        Judgment: Judgment normal.   CBC    Component Value Date/Time   WBC 6.4 11/20/2019 1603   RBC 4.91 11/20/2019 1603   HGB 14.1 11/20/2019 1603   HCT 43.3 11/20/2019 1603   PLT 245 11/20/2019 1603   MCV 88.2 11/20/2019 1603   MCH 28.7 11/20/2019 1603   MCHC 32.6 11/20/2019 1603   RDW 13.8 11/20/2019 1603   LYMPHSABS 0.7 12/29/2009 1300   MONOABS 0.8 12/29/2009 1300   EOSABS 0.0 12/29/2009 1300   BASOSABS 0.1 12/29/2009 1300   BMP Latest Ref Rng & Units 11/20/2019 05/19/2017 10/02/2015  Glucose 70 - 99 mg/dL 128(H) 128(H) 146(H)  BUN 8 - 23 mg/dL 16 12 9   Creatinine 0.44 - 1.00 mg/dL 0.55 0.72 0.53  Sodium 135 - 145 mmol/L 139 140 138  Potassium 3.5 - 5.1 mmol/L 4.0 4.4 3.4(L)  Chloride 98  - 111 mmol/L 103 103 104  CO2 22 - 32 mmol/L 29 27 27   Calcium 8.9 - 10.3 mg/dL 8.5(L) 8.8(L) 8.8(L)   Chest imaging: CT Chest LCS 2019 1. Lung-RADS 2, benign appearance or behavior. Continue annual screening with low-dose chest CT without contrast in 12 months. 2. Aortic atherosclerosis, coronary artery atherosclerosis and emphysema  PFT: No flowsheet data found.  Labs:  Path:  Echo 04/28/2019: LV normal size and EF 60%. Grade I diastolic dysfunction. Mild mitral regurgitation. Mild tricuspid regurgitation.   Heart Catheterization:  Assessment & Plan:   Centrilobular emphysema ([place])  Hoarseness of voice - Plan: Ambulatory referral to ENT  Discussion: Elizabeth Velazquez is an 80 year old woman, daily smoker with GERD who is referred to pulmonary clinic for evaluation of COPD.   She has evidence of emphysema from her CT chest scan in 2019 with significant smoking history.  She is to continue Symbicort inhaler 2 puffs twice daily.  This was stressed to the patient and her family as she was using it as needed.  We will add Spiriva 2.5 MCG 2 puffs daily to her regimen.  She can continue to use albuterol inhaler or nebulizer treatments as needed.  She has wheezing and reduced air movement on exam today so we will treat her for COPD exacerbation with prednisone 40 mg x 5  days along with 5 days of azithromycin.  She will be referred to ENT for further evaluation of her voice hoarseness.  Follow-up in 2 months.  Freda Jackson, MD  Pulmonary & Critical Care Office: [phone]   Current Outpatient Medications:    albuterol (PROVENTIL HFA;VENTOLIN HFA) 108 (90 BASE) MCG/ACT inhaler, Inhale 1 puff into the lungs every 6 (six) hours as needed for shortness of breath., Disp: , Rfl:    albuterol (PROVENTIL) (2.5 MG/3ML) 0.083% nebulizer solution, Take 2.5 mg by nebulization every 6 (six) hours as needed for shortness of breath. , Disp: , Rfl:    alendronate (FOSAMAX) 70 MG  tablet, Take 70 mg by mouth once a week. Take with a full glass of water on an empty stomach., Disp: , Rfl:    amLODipine (NORVASC) 5 MG tablet, TAKE 1 TABLET(5 MG) BY MOUTH DAILY, Disp: 90 tablet, Rfl: 1   amLODipine (NORVASC) 5 MG tablet, 1 tablet, Disp: , Rfl:    aspirin EC 81 MG tablet, Take 1 tablet (81 mg total) by mouth daily., Disp: 90 tablet, Rfl: 3   atorvastatin (LIPITOR) 20 MG tablet, Take 20 mg by mouth daily., Disp: , Rfl:    atorvastatin (LIPITOR) 20 MG tablet, Take 1 tablet by mouth daily., Disp: , Rfl:    baclofen (LIORESAL) 10 MG tablet, Take 10 mg by mouth 3 (three) times daily as needed for muscle spasms. , Disp: , Rfl:    Cholecalciferol (VITAMIN D3) 125 MCG (5000 UT) CAPS, 1 capsule, Disp: , Rfl:    Cholecalciferol (VITAMIN D3) 2000 UNITS TABS, Take 1 tablet by mouth daily., Disp: , Rfl:    clonazePAM (KLONOPIN) 0.5 MG tablet, Take 0.25-0.5 mg by mouth 2 (two) times daily as needed for anxiety. , Disp: , Rfl:    Digestive Enzymes (ACIDOLL) CAPS, Take by mouth., Disp: , Rfl:    gabapentin (NEURONTIN) 400 MG capsule, Take 400 mg by mouth 3 (three) times daily., Disp: , Rfl:    HYDROcodone Bitartrate ER 40 MG T24A, Hysingla ER 40 mg tablet, crush resistant, extended release  TAKE 1 TABLET BY MOUTH ONCE A DAY, Disp: , Rfl:    HYDROcodone-acetaminophen (NORCO/VICODIN) 5-325 MG per tablet, Take 1 tablet by mouth every 6 (six) hours as needed for moderate pain., Disp: , Rfl:    omeprazole (PRILOSEC) 40 MG capsule, Take 40 mg by mouth daily., Disp: , Rfl:    omeprazole (PRILOSEC) 40 MG capsule, take 1 capsule by mouth every day 30 minutes before breakfast, Disp: , Rfl:    SYMBICORT 160-4.5 MCG/ACT inhaler, Inhale 2 puffs into the lungs 2 (two) times daily., Disp: , Rfl:    TURMERIC PO, Take 1 tablet by mouth daily., Disp: , Rfl:    venlafaxine XR (EFFEXOR-XR) 150 MG 24 hr capsule, Take 300 mg by mouth daily. , Disp: , Rfl:    zolpidem (AMBIEN) 5 MG tablet, 1 tablet at bedtime., Disp:  , Rfl:

## 2021-03-13 NOTE — Patient Instructions (Addendum)
Start spiriva inhaler 2 puffs daily  Use symbicort 2 puffs twice daily - rinse mouth after each use  Use albuterol inhaler or nebulizer treatment every 4-6 hours as needed.   We will refer you to ENT group for further evaluation of your voice  Take prednisone 40mg  x 5 days  Take azithromycin for 5 days

## 2021-03-13 NOTE — Progress Notes (Signed)
Patient seen in the office today and instructed on use of Spiriva 2.57mcg.  Patient expressed understanding and demonstrated technique.  Elizabeth Velazquez [facility] 03/13/2021

## 2021-03-24 DIAGNOSIS — E7800 Pure hypercholesterolemia, unspecified: Secondary | ICD-10-CM | POA: Diagnosis not present

## 2021-03-24 DIAGNOSIS — R7303 Prediabetes: Secondary | ICD-10-CM | POA: Diagnosis not present

## 2021-03-24 DIAGNOSIS — Z0000 Encounter for general adult medical examination without abnormal findings: Secondary | ICD-10-CM | POA: Diagnosis not present

## 2021-03-24 DIAGNOSIS — E559 Vitamin D deficiency, unspecified: Secondary | ICD-10-CM | POA: Diagnosis not present

## 2021-03-26 DIAGNOSIS — I10 Essential (primary) hypertension: Secondary | ICD-10-CM | POA: Diagnosis not present

## 2021-03-26 DIAGNOSIS — M47812 Spondylosis without myelopathy or radiculopathy, cervical region: Secondary | ICD-10-CM | POA: Diagnosis not present

## 2021-03-26 DIAGNOSIS — F3341 Major depressive disorder, recurrent, in partial remission: Secondary | ICD-10-CM | POA: Diagnosis not present

## 2021-03-26 DIAGNOSIS — M47816 Spondylosis without myelopathy or radiculopathy, lumbar region: Secondary | ICD-10-CM | POA: Diagnosis not present

## 2021-03-26 DIAGNOSIS — M797 Fibromyalgia: Secondary | ICD-10-CM | POA: Diagnosis not present

## 2021-03-26 DIAGNOSIS — Z0000 Encounter for general adult medical examination without abnormal findings: Secondary | ICD-10-CM | POA: Diagnosis not present

## 2021-03-26 DIAGNOSIS — G894 Chronic pain syndrome: Secondary | ICD-10-CM | POA: Diagnosis not present

## 2021-03-26 DIAGNOSIS — I25111 Atherosclerotic heart disease of native coronary artery with angina pectoris with documented spasm: Secondary | ICD-10-CM | POA: Diagnosis not present

## 2021-04-04 DIAGNOSIS — M810 Age-related osteoporosis without current pathological fracture: Secondary | ICD-10-CM | POA: Diagnosis not present

## 2021-04-04 DIAGNOSIS — K219 Gastro-esophageal reflux disease without esophagitis: Secondary | ICD-10-CM | POA: Diagnosis not present

## 2021-04-04 DIAGNOSIS — E7800 Pure hypercholesterolemia, unspecified: Secondary | ICD-10-CM | POA: Diagnosis not present

## 2021-04-04 DIAGNOSIS — I10 Essential (primary) hypertension: Secondary | ICD-10-CM | POA: Diagnosis not present

## 2021-04-16 DIAGNOSIS — Z9981 Dependence on supplemental oxygen: Secondary | ICD-10-CM | POA: Diagnosis not present

## 2021-04-16 DIAGNOSIS — F17200 Nicotine dependence, unspecified, uncomplicated: Secondary | ICD-10-CM | POA: Diagnosis not present

## 2021-04-16 DIAGNOSIS — J441 Chronic obstructive pulmonary disease with (acute) exacerbation: Secondary | ICD-10-CM | POA: Diagnosis not present

## 2021-04-16 DIAGNOSIS — R911 Solitary pulmonary nodule: Secondary | ICD-10-CM | POA: Diagnosis not present

## 2021-04-16 DIAGNOSIS — J432 Centrilobular emphysema: Secondary | ICD-10-CM | POA: Diagnosis not present

## 2021-04-17 ENCOUNTER — Ambulatory Visit: Payer: Medicare Other | Admitting: Cardiology

## 2021-04-17 ENCOUNTER — Other Ambulatory Visit: Payer: Self-pay | Admitting: Registered Nurse

## 2021-04-17 DIAGNOSIS — R911 Solitary pulmonary nodule: Secondary | ICD-10-CM

## 2021-04-18 DIAGNOSIS — J441 Chronic obstructive pulmonary disease with (acute) exacerbation: Secondary | ICD-10-CM | POA: Diagnosis not present

## 2021-04-18 DIAGNOSIS — R0602 Shortness of breath: Secondary | ICD-10-CM | POA: Diagnosis not present

## 2021-04-18 DIAGNOSIS — R9389 Abnormal findings on diagnostic imaging of other specified body structures: Secondary | ICD-10-CM | POA: Diagnosis not present

## 2021-04-22 DIAGNOSIS — J449 Chronic obstructive pulmonary disease, unspecified: Secondary | ICD-10-CM | POA: Diagnosis not present

## 2021-05-07 DIAGNOSIS — M797 Fibromyalgia: Secondary | ICD-10-CM | POA: Diagnosis not present

## 2021-05-07 DIAGNOSIS — G894 Chronic pain syndrome: Secondary | ICD-10-CM | POA: Diagnosis not present

## 2021-05-07 DIAGNOSIS — M47816 Spondylosis without myelopathy or radiculopathy, lumbar region: Secondary | ICD-10-CM | POA: Diagnosis not present

## 2021-05-07 DIAGNOSIS — M47812 Spondylosis without myelopathy or radiculopathy, cervical region: Secondary | ICD-10-CM | POA: Diagnosis not present

## 2021-05-12 DIAGNOSIS — F17210 Nicotine dependence, cigarettes, uncomplicated: Secondary | ICD-10-CM | POA: Diagnosis not present

## 2021-05-12 DIAGNOSIS — J387 Other diseases of larynx: Secondary | ICD-10-CM | POA: Diagnosis not present

## 2021-05-12 DIAGNOSIS — J3489 Other specified disorders of nose and nasal sinuses: Secondary | ICD-10-CM | POA: Diagnosis not present

## 2021-05-12 DIAGNOSIS — K219 Gastro-esophageal reflux disease without esophagitis: Secondary | ICD-10-CM | POA: Diagnosis not present

## 2021-05-13 ENCOUNTER — Other Ambulatory Visit: Payer: Medicare Other

## 2021-05-14 ENCOUNTER — Ambulatory Visit
Admission: RE | Admit: 2021-05-14 | Discharge: 2021-05-14 | Disposition: A | Discharge status: Home / Self Care | Payer: Medicare Other | Source: Ambulatory Visit | Attending: Registered Nurse | Admitting: Registered Nurse

## 2021-05-14 DIAGNOSIS — R911 Solitary pulmonary nodule: Secondary | ICD-10-CM

## 2021-05-14 DIAGNOSIS — J439 Emphysema, unspecified: Secondary | ICD-10-CM | POA: Diagnosis not present

## 2021-05-19 ENCOUNTER — Other Ambulatory Visit: Payer: Self-pay

## 2021-05-19 ENCOUNTER — Encounter: Payer: Self-pay | Admitting: Pulmonary Disease

## 2021-05-19 ENCOUNTER — Ambulatory Visit: Payer: Medicare Other | Admitting: Pulmonary Disease

## 2021-05-19 VITALS — BP 128/64 | HR 99 | Ht <= 58 in | Wt 130.0 lb

## 2021-05-19 DIAGNOSIS — J432 Centrilobular emphysema: Secondary | ICD-10-CM | POA: Diagnosis not present

## 2021-05-19 DIAGNOSIS — R918 Other nonspecific abnormal finding of lung field: Secondary | ICD-10-CM

## 2021-05-19 DIAGNOSIS — J449 Chronic obstructive pulmonary disease, unspecified: Secondary | ICD-10-CM | POA: Diagnosis not present

## 2021-05-19 NOTE — Patient Instructions (Addendum)
We will schedule you for CT Chest scan in June to follow up on the new left upper lobe nodule.   I would recommend moving forward with biopsy of the vocal cord spots in order to find out what you are dealing with and to discuss potential treatment options (whether you want to move forward with them or not).  Continue symbicort inhaler 2 puffs twice daily  Continue spiriva 2 puffs daily

## 2021-05-19 NOTE — Progress Notes (Signed)
Synopsis: Referred in December 2022 for COPD by Deland Pretty, MD  Subjective:   PATIENT ID: Elizabeth Velazquez GENDER: female DOB: 04-14-1940, MRN: [id number]  HPI  Chief Complaint  Patient presents with   Follow-up    2 mo f/u for COPD. States her breathing has been stable since last visit.    Elizabeth Velazquez is an 81 year old woman, daily smoker with GERD who returns to pulmonary clinic for COPD.   Her breathing improved after last visit with prednisone taper along with using symbicort inhaler 2 puffs twice daily (instead of as needed) along with spiriva 2.33mcg 2 puffs daily.  She is undergoing evaluation by Dr. Fredric Dine of ENT for hoarseness of her voice and concern for abnormal lesion on her vocal cords.  CT Chest scan 05/14/21 show new 6.20mm left upper lobe nodule with spiculated border. Other noted pulmonary nodules remain stable. Moderate emphysematous changes and bronchial wall thickening again noted.  OV 03/13/2022 Patient reports hoarseness and loss of her voice a few months ago.  She also has significant shortness of breath especially with exertion.  She does have intermittent wheezing and daily cough with sputum production.  She does have a nebulizer machine at home and is not using it much.  She is using Symbicort 160-4.5 MCG 1 puff in the morning and another puff around midday.  She is smoking 1 to 2 packs/day and has been smoking for 65 years.  She had a CT chest lung cancer screening scan in 2019 which showed moderate bullous type emphysema and lower lobe predominant bronchial thickening.  Past Medical History:  Diagnosis Date   Anxiety    Arthritis    COPD (chronic obstructive pulmonary disease) (HCC)    Dysphagia    GERD (gastroesophageal reflux disease)    History of hiatal hernia    Hyperlipidemia    Shoulder fracture, left    Skin cancer    Wears dentures    Wears glasses      Family History  Problem Relation Age of Onset   Stroke Mother    Alcohol abuse  Father    Heart attack Father    Heart disease Father      Social History   Socioeconomic History   Marital status: Widowed    Spouse name: Not on file   Number of children: 2   Years of education: Not on file   Highest education level: Not on file  Occupational History   Not on file  Tobacco Use   Smoking status: Every Day    Packs/day: 2.00    Years: 65.00    Pack years: 130.00    Types: Cigarettes   Smokeless tobacco: Never  Vaping Use   Vaping Use: Never used  Substance and Sexual Activity   Alcohol use: No   Drug use: No   Sexual activity: Not on file  Other Topics Concern   Not on file  Social History Narrative   Not on file   Social Determinants of Health   Financial Resource Strain: Not on file  Food Insecurity: Not on file  Transportation Needs: Not on file  Physical Activity: Not on file  Stress: Not on file  Social Connections: Not on file  Intimate Partner Violence: Not on file     Allergies  Allergen Reactions   Penicillins     Did it involve swelling of the face/tongue/throat, SOB, or low BP? Unknown Did it involve sudden or severe rash/hives, skin peeling, or any  reaction on the inside of your mouth or nose? Unknown Did you need to seek medical attention at a hospital or doctor's office? Unknown When did it last happen?      childhood If all above answers are NO, may proceed with cephalosporin use.      Outpatient Medications Prior to Visit  Medication Sig Dispense Refill   albuterol (PROVENTIL HFA;VENTOLIN HFA) 108 (90 BASE) MCG/ACT inhaler Inhale 1 puff into the lungs every 6 (six) hours as needed for shortness of breath.     albuterol (PROVENTIL) (2.5 MG/3ML) 0.083% nebulizer solution Take 2.5 mg by nebulization every 6 (six) hours as needed for shortness of breath.      alendronate (FOSAMAX) 70 MG tablet Take 70 mg by mouth once a week. Take with a full glass of water on an empty stomach.     amLODipine (NORVASC) 5 MG tablet TAKE 1  TABLET(5 MG) BY MOUTH DAILY 90 tablet 1   amLODipine (NORVASC) 5 MG tablet 1 tablet     aspirin EC 81 MG tablet Take 1 tablet (81 mg total) by mouth daily. 90 tablet 3   atorvastatin (LIPITOR) 20 MG tablet Take 20 mg by mouth daily.     atorvastatin (LIPITOR) 20 MG tablet Take 1 tablet by mouth daily.     baclofen (LIORESAL) 10 MG tablet Take 10 mg by mouth 3 (three) times daily as needed for muscle spasms.      Cholecalciferol (VITAMIN D3) 2000 UNITS TABS Take 1 tablet by mouth daily.     clonazePAM (KLONOPIN) 0.5 MG tablet Take 0.25-0.5 mg by mouth 2 (two) times daily as needed for anxiety.      Digestive Enzymes (ACIDOLL) CAPS Take by mouth.     gabapentin (NEURONTIN) 400 MG capsule Take 400 mg by mouth 3 (three) times daily.     HYDROcodone Bitartrate ER 40 MG T24A Hysingla ER 40 mg tablet, crush resistant, extended release  TAKE 1 TABLET BY MOUTH ONCE A DAY     HYDROcodone-acetaminophen (NORCO/VICODIN) 5-325 MG per tablet Take 1 tablet by mouth every 6 (six) hours as needed for moderate pain.     omeprazole (PRILOSEC) 40 MG capsule 80 mg.     SYMBICORT 160-4.5 MCG/ACT inhaler Inhale 2 puffs into the lungs 2 (two) times daily.     Tiotropium Bromide Monohydrate (SPIRIVA RESPIMAT) 2.5 MCG/ACT AERS Inhale 2 puffs into the lungs daily. 8 g 0   TURMERIC PO Take 1 tablet by mouth daily.     venlafaxine XR (EFFEXOR-XR) 150 MG 24 hr capsule Take 300 mg by mouth daily.      zolpidem (AMBIEN) 5 MG tablet 1 tablet at bedtime.     azithromycin (ZITHROMAX) 250 MG tablet Take as directed 6 tablet 0   Cholecalciferol (VITAMIN D3) 125 MCG (5000 UT) CAPS 1 capsule     omeprazole (PRILOSEC) 40 MG capsule Take 40 mg by mouth daily.     No facility-administered medications prior to visit.   Review of Systems  Constitutional:  Negative for chills, fever, malaise/fatigue and weight loss.  HENT:  Negative for congestion, sinus pain and sore throat.        Hoarseness of voice  Eyes: Negative.    Respiratory:  Negative for cough, hemoptysis, sputum production, shortness of breath and wheezing.   Cardiovascular:  Negative for chest pain, palpitations, orthopnea, claudication and leg swelling.  Gastrointestinal:  Negative for abdominal pain, heartburn, nausea and vomiting.  Genitourinary: Negative.   Musculoskeletal:  Negative for  joint pain and myalgias.  Skin:  Negative for rash.  Neurological:  Negative for weakness.  Endo/Heme/Allergies: Negative.   Psychiatric/Behavioral: Negative.     Objective:   Vitals:   05/19/21 1336  BP: 128/64  Pulse: 99  SpO2: 94%  Weight: 130 lb (59 kg)  Height: 4\' 9"  (1.448 m)    Physical Exam Constitutional:      General: She is not in acute distress.    Appearance: She is not ill-appearing.  HENT:     Head: Normocephalic and atraumatic.  Eyes:     General: No scleral icterus.    Conjunctiva/sclera: Conjunctivae normal.  Cardiovascular:     Rate and Rhythm: Normal rate and regular rhythm.     Pulses: Normal pulses.     Heart sounds: Normal heart sounds. No murmur heard. Pulmonary:     Effort: Pulmonary effort is normal.     Breath sounds: Decreased air movement present. Decreased breath sounds present. No wheezing, rhonchi or rales.  Musculoskeletal:     Right lower leg: No edema.     Left lower leg: No edema.  Skin:    General: Skin is warm and dry.  Neurological:     General: No focal deficit present.     Mental Status: She is alert.  Psychiatric:        Mood and Affect: Mood normal.        Behavior: Behavior normal.        Thought Content: Thought content normal.        Judgment: Judgment normal.   CBC    Component Value Date/Time   WBC 6.4 11/20/2019 1603   RBC 4.91 11/20/2019 1603   HGB 14.1 11/20/2019 1603   HCT 43.3 11/20/2019 1603   PLT 245 11/20/2019 1603   MCV 88.2 11/20/2019 1603   MCH 28.7 11/20/2019 1603   MCHC 32.6 11/20/2019 1603   RDW 13.8 11/20/2019 1603   LYMPHSABS 0.7 12/29/2009 1300   MONOABS  0.8 12/29/2009 1300   EOSABS 0.0 12/29/2009 1300   BASOSABS 0.1 12/29/2009 1300   BMP Latest Ref Rng & Units 11/20/2019 05/19/2017 10/02/2015  Glucose 70 - 99 mg/dL 128(H) 128(H) 146(H)  BUN 8 - 23 mg/dL 16 12 9   Creatinine 0.44 - 1.00 mg/dL 0.55 0.72 0.53  Sodium 135 - 145 mmol/L 139 140 138  Potassium 3.5 - 5.1 mmol/L 4.0 4.4 3.4(L)  Chloride 98 - 111 mmol/L 103 103 104  CO2 22 - 32 mmol/L 29 27 27   Calcium 8.9 - 10.3 mg/dL 8.5(L) 8.8(L) 8.8(L)   Chest imaging: CT Chest 05/14/21 1. New 6.5 mm left upper lobe pulmonary nodule with spiculated and irregular margins, highly suspicious for primary bronchogenic malignancy. This nodule is too small for PET characterization, however is suspicious by CT. Consider multi disciplinary referral for further workup options. 2. All of the additional pulmonary nodules are unchanged from prior exam, largest measuring 4-5 mm, many of which are perifissural or subpleural in location. 3. Moderate emphysema and bronchial thickening. 4. Aortic atherosclerosis. Coronary artery calcifications or stents.  CT Chest LCS 2019 1. Lung-RADS 2, benign appearance or behavior. Continue annual screening with low-dose chest CT without contrast in 12 months. 2. Aortic atherosclerosis, coronary artery atherosclerosis and emphysema  PFT: No flowsheet data found.  Labs:  Path:  Echo 04/28/2019: LV normal size and EF 60%. Grade I diastolic dysfunction. Mild mitral regurgitation. Mild tricuspid regurgitation.   Heart Catheterization:  Assessment & Plan:   Centrilobular emphysema ([place])  Pulmonary nodules - Plan: CT Chest Wo Contrast  Discussion: Kinesha Auten is an 81 year old woman, daily smoker with GERD who returns to pulmonary clinic for COPD.   She has radiographic evidence of emphysema along with bronchial wall thickening due to ongoing smoking. I discussed the importance of smoking cessation but she continues to smoke as she is very anxious about the  findings of her vocal cords and concern for throat cancer.   She is to continue Symbicort inhaler 2 puffs twice daily and Spiriva 2.5 MCG 2 puffs daily to her regimen.  She can continue to use albuterol inhaler or nebulizer treatments as needed.  We will follow up a CT chest scan in June for follow up of the new left upper lobe nodule.  Follow-up in 6 months.  Elizabeth Jackson, MD [facility] Office: [phone]   Current Outpatient Medications:    albuterol (PROVENTIL HFA;VENTOLIN HFA) 108 (90 BASE) MCG/ACT inhaler, Inhale 1 puff into the lungs every 6 (six) hours as needed for shortness of breath., Disp: , Rfl:    albuterol (PROVENTIL) (2.5 MG/3ML) 0.083% nebulizer solution, Take 2.5 mg by nebulization every 6 (six) hours as needed for shortness of breath. , Disp: , Rfl:    alendronate (FOSAMAX) 70 MG tablet, Take 70 mg by mouth once a week. Take with a full glass of water on an empty stomach., Disp: , Rfl:    amLODipine (NORVASC) 5 MG tablet, TAKE 1 TABLET(5 MG) BY MOUTH DAILY, Disp: 90 tablet, Rfl: 1   amLODipine (NORVASC) 5 MG tablet, 1 tablet, Disp: , Rfl:    aspirin EC 81 MG tablet, Take 1 tablet (81 mg total) by mouth daily., Disp: 90 tablet, Rfl: 3   atorvastatin (LIPITOR) 20 MG tablet, Take 20 mg by mouth daily., Disp: , Rfl:    atorvastatin (LIPITOR) 20 MG tablet, Take 1 tablet by mouth daily., Disp: , Rfl:    baclofen (LIORESAL) 10 MG tablet, Take 10 mg by mouth 3 (three) times daily as needed for muscle spasms. , Disp: , Rfl:    Cholecalciferol (VITAMIN D3) 2000 UNITS TABS, Take 1 tablet by mouth daily., Disp: , Rfl:    clonazePAM (KLONOPIN) 0.5 MG tablet, Take 0.25-0.5 mg by mouth 2 (two) times daily as needed for anxiety. , Disp: , Rfl:    Digestive Enzymes (ACIDOLL) CAPS, Take by mouth., Disp: , Rfl:    gabapentin (NEURONTIN) 400 MG capsule, Take 400 mg by mouth 3 (three) times daily., Disp: , Rfl:    HYDROcodone Bitartrate ER 40 MG T24A, Hysingla ER  40 mg tablet, crush resistant, extended release  TAKE 1 TABLET BY MOUTH ONCE A DAY, Disp: , Rfl:    HYDROcodone-acetaminophen (NORCO/VICODIN) 5-325 MG per tablet, Take 1 tablet by mouth every 6 (six) hours as needed for moderate pain., Disp: , Rfl:    omeprazole (PRILOSEC) 40 MG capsule, 80 mg., Disp: , Rfl:    SYMBICORT 160-4.5 MCG/ACT inhaler, Inhale 2 puffs into the lungs 2 (two) times daily., Disp: , Rfl:    Tiotropium Bromide Monohydrate (SPIRIVA RESPIMAT) 2.5 MCG/ACT AERS, Inhale 2 puffs into the lungs daily., Disp: 8 g, Rfl: 0   TURMERIC PO, Take 1 tablet by mouth daily., Disp: , Rfl:    venlafaxine XR (EFFEXOR-XR) 150 MG 24 hr capsule, Take 300 mg by mouth daily. , Disp: , Rfl:    zolpidem (AMBIEN) 5 MG tablet, 1 tablet at bedtime., Disp: , Rfl:

## 2021-05-26 ENCOUNTER — Ambulatory Visit: Payer: Medicare Other | Admitting: Cardiology

## 2021-05-26 NOTE — Progress Notes (Incomplete)
Follow up visit  Subjective:   Elizabeth Velazquez, female    DOB: 1940/10/18, 81 y.o.   MRN: [id number]    *** HPI  No chief complaint on file.   81 y.o. *** female with ***  My note from 04/2019: 81 y.o. Caucasian female with hyperlipidemia, GERD, COPD, tobacco dependence, referred for management of coronary artery disease noted on CT chest.   Coronary atherosclerosis: Evident on CT chest. >  100-pack-year smoker with risk factors for CAD, including hypertension.  She does not have any symptoms of angina at this time.  Stable exertional dyspnea likely related to COPD. Recommend medical management with Aspirin, statin. Started amlodipine 5 mg for blood pressure control. Will obtain echocardiogram.  Recommend stress testing, only if EF is reduced on echocardiogram, or if she has any recurrent angina symptoms.   ***  *** Current Outpatient Medications on File Prior to Visit  Medication Sig Dispense Refill   albuterol (PROVENTIL HFA;VENTOLIN HFA) 108 (90 BASE) MCG/ACT inhaler Inhale 1 puff into the lungs every 6 (six) hours as needed for shortness of breath.     albuterol (PROVENTIL) (2.5 MG/3ML) 0.083% nebulizer solution Take 2.5 mg by nebulization every 6 (six) hours as needed for shortness of breath.      alendronate (FOSAMAX) 70 MG tablet Take 70 mg by mouth once a week. Take with a full glass of water on an empty stomach.     amLODipine (NORVASC) 5 MG tablet TAKE 1 TABLET(5 MG) BY MOUTH DAILY 90 tablet 1   amLODipine (NORVASC) 5 MG tablet 1 tablet     aspirin EC 81 MG tablet Take 1 tablet (81 mg total) by mouth daily. 90 tablet 3   atorvastatin (LIPITOR) 20 MG tablet Take 20 mg by mouth daily.     atorvastatin (LIPITOR) 20 MG tablet Take 1 tablet by mouth daily.     baclofen (LIORESAL) 10 MG tablet Take 10 mg by mouth 3 (three) times daily as needed for muscle spasms.      Cholecalciferol (VITAMIN D3) 2000 UNITS TABS Take 1 tablet by mouth daily.     clonazePAM  (KLONOPIN) 0.5 MG tablet Take 0.25-0.5 mg by mouth 2 (two) times daily as needed for anxiety.      Digestive Enzymes (ACIDOLL) CAPS Take by mouth.     gabapentin (NEURONTIN) 400 MG capsule Take 400 mg by mouth 3 (three) times daily.     HYDROcodone Bitartrate ER 40 MG T24A Hysingla ER 40 mg tablet, crush resistant, extended release  TAKE 1 TABLET BY MOUTH ONCE A DAY     HYDROcodone-acetaminophen (NORCO/VICODIN) 5-325 MG per tablet Take 1 tablet by mouth every 6 (six) hours as needed for moderate pain.     omeprazole (PRILOSEC) 40 MG capsule 80 mg.     SYMBICORT 160-4.5 MCG/ACT inhaler Inhale 2 puffs into the lungs 2 (two) times daily.     Tiotropium Bromide Monohydrate (SPIRIVA RESPIMAT) 2.5 MCG/ACT AERS Inhale 2 puffs into the lungs daily. 8 g 0   TURMERIC PO Take 1 tablet by mouth daily.     venlafaxine XR (EFFEXOR-XR) 150 MG 24 hr capsule Take 300 mg by mouth daily.      zolpidem (AMBIEN) 5 MG tablet 1 tablet at bedtime.     No current facility-administered medications on file prior to visit.    Cardiovascular & other pertient studies:  *** EKG ***/***/202***: ***  [facility] ECHOCARDIOGRAM 04/28/2019  Narrative Echocardiogram 04/28/2019: Left ventricle cavity is normal in size. Mild  concentric hypertrophy of the left ventricle. Normal global wall motion. Normal LV systolic function with EF 60%. Doppler evidence of grade I (impaired) diastolic dysfunction, normal LAP. Mild (Grade I) mitral regurgitation. Mild tricuspid regurgitation. No evidence of pulmonary hypertension.     Recent labs: ***/***/202***: Glucose ***, BUN/Cr ***/***. EGFR ***. Na/K ***/***. ***Rest of the CMP normal H/H ***/***. MCV ***. Platelets *** ***HbA1C ***% Chol ***, TG ***, HDL ***, LDL *** ***TSH ***normal   *** ROS      *** There were no vitals filed for this visit.  There is no height or weight on file to calculate BMI. There were no vitals filed for this visit.  *** Objective:    Physical Exam        Assessment & Recommendations:   ***  ***    Nigel Mormon, MD Pager: [phone] Office: [phone]

## 2021-05-27 ENCOUNTER — Encounter: Payer: Self-pay | Admitting: Pulmonary Disease

## 2021-06-02 ENCOUNTER — Other Ambulatory Visit: Payer: Self-pay

## 2021-06-02 ENCOUNTER — Ambulatory Visit: Payer: Medicare Other | Admitting: Cardiology

## 2021-06-02 ENCOUNTER — Encounter: Payer: Self-pay | Admitting: Cardiology

## 2021-06-02 VITALS — BP 128/78 | HR 96 | Temp 98.6°F | Resp 17 | Ht <= 58 in | Wt 110.4 lb

## 2021-06-02 DIAGNOSIS — E782 Mixed hyperlipidemia: Secondary | ICD-10-CM | POA: Diagnosis not present

## 2021-06-02 DIAGNOSIS — F17210 Nicotine dependence, cigarettes, uncomplicated: Secondary | ICD-10-CM | POA: Diagnosis not present

## 2021-06-02 DIAGNOSIS — I2510 Atherosclerotic heart disease of native coronary artery without angina pectoris: Secondary | ICD-10-CM

## 2021-06-02 DIAGNOSIS — R0989 Other specified symptoms and signs involving the circulatory and respiratory systems: Secondary | ICD-10-CM | POA: Diagnosis not present

## 2021-06-02 MED ORDER — NICOTINE POLACRILEX 2 MG MT GUM: 2.0000 mg | CHEWING_GUM | OROMUCOSAL | 2 refills | Status: DC | PRN

## 2021-06-02 MED ORDER — NICOTINE 14 MG/24HR TD PT24: 14.0000 mg | MEDICATED_PATCH | Freq: Every day | TRANSDERMAL | 2 refills | Status: DC

## 2021-06-02 NOTE — Progress Notes (Signed)
Follow up visit  Subjective:   Elizabeth Velazquez, female    DOB: [birth year], 81 y.o.   MRN: [id number]   HPI  Chief Complaint  Patient presents with   Follow-up   Coronary Artery Disease   FNP    81 y.o. Caucasian female with hyperlipidemia, GERD, COPD, tobacco dependence, CAD, suspected vocal cord and lung malignancy  Patient was last seen by me in 04/2019 CT chest retrocardiac).  She had > 100-pack-year smoker with risk factors for CAD, including hypertension, she did not have any symptoms of angina at that time. I felt stable exertional dyspnea was likely related to COPD. I recommended medical management with Aspirin, statin, started amlodipine 5 mg for blood pressure control. Echocardiogram showed normal LVEF.  Patient is here with her daughter-in-law today.  There have been significant developments related to her health last few months.  She was found to have a spiculated nodule suspicious for malignancy on lung cancer surveillance CT chest, for which she is going to undergo repeat CT chest in November 2023, followed by Dr. Freda Jackson.  For the last several weeks, patient has had dysphonia.  She reportedly underwent a test ?  Laryngoscopy, with [facility] and was found to have a "mass suspicious for malignancy.  Patient is scheduled to undergo vocal cord biopsy under general anesthesia tomorrow with [facility].  Last year or so, patient has had worsening baseline functional capacity.  This is due to combination of back and neck pain, as well as exertional dyspnea.  She denies any chest pain.  Unfortunately, she continues to smoke close to 2 packs of cigarettes every day.  She blames this on her anxiety.     Current Outpatient Medications on File Prior to Visit  Medication Sig Dispense Refill   albuterol (PROVENTIL HFA;VENTOLIN HFA) 108 (90 BASE) MCG/ACT inhaler Inhale 1 puff into the lungs every 6 (six) hours as needed for shortness of breath.     albuterol (PROVENTIL)  (2.5 MG/3ML) 0.083% nebulizer solution Take 2.5 mg by nebulization every 6 (six) hours as needed for shortness of breath.      alendronate (FOSAMAX) 70 MG tablet Take 70 mg by mouth once a week. Take with a full glass of water on an empty stomach.     amLODipine (NORVASC) 5 MG tablet TAKE 1 TABLET(5 MG) BY MOUTH DAILY 90 tablet 1   amLODipine (NORVASC) 5 MG tablet 1 tablet     atorvastatin (LIPITOR) 20 MG tablet Take 20 mg by mouth daily.     baclofen (LIORESAL) 10 MG tablet Take 10 mg by mouth 3 (three) times daily as needed for muscle spasms.      Cholecalciferol (VITAMIN D3) 2000 UNITS TABS Take 1 tablet by mouth daily.     gabapentin (NEURONTIN) 400 MG capsule Take 400 mg by mouth 3 (three) times daily.     HYDROcodone-acetaminophen (NORCO/VICODIN) 5-325 MG per tablet Take 1 tablet by mouth every 6 (six) hours as needed for moderate pain.     magnesium hydroxide (MILK OF MAGNESIA) 400 MG/5ML suspension Take 5 mLs by mouth as needed.     omeprazole (PRILOSEC) 40 MG capsule 80 mg.     SYMBICORT 160-4.5 MCG/ACT inhaler Inhale 2 puffs into the lungs 2 (two) times daily.     Tiotropium Bromide Monohydrate (SPIRIVA RESPIMAT) 2.5 MCG/ACT AERS Inhale 2 puffs into the lungs daily. 8 g 0   TURMERIC PO Take 1 tablet by mouth as needed.     venlafaxine  XR (EFFEXOR-XR) 150 MG 24 hr capsule Take 300 mg by mouth daily.      zolpidem (AMBIEN) 5 MG tablet 1 tablet at bedtime.     aspirin EC 81 MG tablet Take 1 tablet (81 mg total) by mouth daily. (Patient not taking: Reported on 06/02/2021) 90 tablet 3   clonazePAM (KLONOPIN) 0.5 MG tablet Take 0.25-0.5 mg by mouth 2 (two) times daily as needed for anxiety.  (Patient not taking: Reported on 06/02/2021)     Digestive Enzymes (ACIDOLL) CAPS Take by mouth.     HYDROcodone Bitartrate ER 40 MG T24A Hysingla ER 40 mg tablet, crush resistant, extended release  TAKE 1 TABLET BY MOUTH ONCE A DAY     No current facility-administered medications on file prior to visit.     Cardiovascular & other pertient studies:  EKG 06/02/2021: Sinus rhythm 84 bpm  Nonspecific ST depression  CT Chest 05/14/2021:  (Reviewed and independently interpreted) 1. New 6.5 mm left upper lobe pulmonary nodule with spiculated and irregular margins, highly suspicious for primary bronchogenic malignancy. This nodule is too small for PET characterization, however is suspicious by CT. Consider multi disciplinary referral for further workup options. 2. All of the additional pulmonary nodules are unchanged from prior exam, largest measuring 4-5 mm, many of which are perifissural or subpleural in location. 3. Moderate emphysema and bronchial thickening. 4. Aortic atherosclerosis. Coronary artery calcifications or stents.  Echocardiogram 04/28/2019:  Left ventricle cavity is normal in size. Mild concentric hypertrophy of  the left ventricle. Normal global wall motion. Normal LV systolic function  with EF 60%. Doppler evidence of grade I (impaired) diastolic dysfunction,  normal LAP.  Mild (Grade I) mitral regurgitation.  Mild tricuspid regurgitation.  No evidence of pulmonary hypertension.   Recent labs: 04/16/2021: Glucose 111, BUN/Cr 15/0.66. EGFR 83. Na/K 146/3.8. Rest of the CMP normal H/H 14/45. MCV 85. Platelets 237 HbA1C 5.9% Chol 174, TG 98, HDL 79, LDL 75 TSH 1.0 normal    Review of Systems  HENT:         Change in voice  Cardiovascular:  Positive for dyspnea on exertion. Negative for chest pain, leg swelling, palpitations and syncope.  Musculoskeletal:  Positive for back pain.  Psychiatric/Behavioral:  The patient is nervous/anxious.         Vitals:   06/02/21 1038  BP: 128/78  Pulse: 96  Resp: 17  Temp: 98.6 F (37 C)  SpO2: 92%    Body mass index is 23.89 kg/m. Filed Weights   06/02/21 1038  Weight: 110 lb 6.4 oz (50.1 kg)     Objective:   Physical Exam Vitals and nursing note reviewed.  Constitutional:      General: She is not in  acute distress.    Comments: Dysphonia  Neck:     Vascular: No JVD.  Cardiovascular:     Rate and Rhythm: Normal rate and regular rhythm.     Heart sounds: Normal heart sounds. No murmur heard. Pulmonary:     Effort: Pulmonary effort is normal.     Breath sounds: Normal breath sounds. No wheezing or rales.  Musculoskeletal:     Right lower leg: No edema.     Left lower leg: No edema.      ICD-10-CM   1. Coronary artery disease involving native coronary artery of native heart without angina pectoris  I25.10 EKG 12-Lead    PCV MYOCARDIAL PERFUSION WO LEXISCAN    PCV ECHOCARDIOGRAM COMPLETE    PCV CAROTID DUPLEX (BILATERAL)  2. Mixed hyperlipidemia  E78.2     3. Bruit of right carotid artery  R09.89 PCV CAROTID DUPLEX (BILATERAL)    4. Cigarette nicotine dependence without complication  W88.891 nicotine (NICODERM CQ - DOSED IN MG/24 HOURS) 14 mg/24hr patch    nicotine polacrilex (NICORETTE) 2 MG gum           Assessment & Recommendations:   81 y.o. Caucasian female with hyperlipidemia, GERD, COPD, tobacco dependence, CAD, suspected vocal cord and lung malignancy  Coronary artery disease: Multiple risk factors for CAD including ongoing tobacco abuse, hypertension, hyperlipidemia, noted severe calcification on CT chest, no stents. At this time, she does not have any anginal symptoms.  She has had worsening dyspnea symptoms, which could be related to worsening emphysema, cannot exclude anginal equivalent. She has a planned vocal cord biopsy scheduled for tomorrow for suspected malignant lesion, which is time sensitive.  In absence of clear anginal symptoms, I do not recommend additional work-up prior to the biopsy scheduled tomorrow.  However, I do recommend echocardiogram, Lexiscan nuclear stress test as cardiac restratification testing prior to any upcoming major surgery, may be due to her vocal cord or potentially too long in the future. I recommend aspirin 81 mg daily, unless  recommended to hold in perioperative period. Continue Lipitor 20 mg daily, with which LDL is well controlled.   Her perioperative risk for any vascular or meniscal surgery is elevated owing to her ongoing tobacco abuse and known CAD.  However, the surgery, especially related to any suspicious malignant lesion, is not prohibitively high risk.  Right carotid bruit: No stroke/TIA symptoms at this time. Continue medical management with aspirin and statin, tobacco cessation.  Nicotine dependence: Tobacco cessation counseling:  - Currently smoking 2 packs/day   - Patient was informed of the dangers of tobacco abuse including stroke, cancer, and MI, as well as benefits of tobacco cessation. - Patient is willing to quit at this time. - Approximately 5 mins were spent counseling patient cessation techniques. We discussed various methods to help quit smoking, including deciding on a date to quit, joining a support group, pharmacological agents.  Chantix has not worked in the past, according to the patient.  Patient would like to use nicotine patch/gum. - I will reassess her progress at the next follow-up visit  Further recommendations after above testing     Nigel Mormon, MD Pager: [phone] Office: [phone]

## 2021-06-03 ENCOUNTER — Other Ambulatory Visit: Payer: Self-pay

## 2021-06-03 ENCOUNTER — Ambulatory Visit (HOSPITAL_BASED_OUTPATIENT_CLINIC_OR_DEPARTMENT_OTHER): Admit: 2021-06-03 | Payer: Medicare Other | Admitting: Otolaryngology

## 2021-06-03 ENCOUNTER — Encounter (HOSPITAL_COMMUNITY): Payer: Self-pay | Admitting: Otolaryngology

## 2021-06-03 ENCOUNTER — Other Ambulatory Visit: Payer: Self-pay | Admitting: Otolaryngology

## 2021-06-03 NOTE — Progress Notes (Signed)
Spoke with pt's daughter-in-law, April for pre-op call. DPR on file. Pt saw Dr. Virgina Jock yesterday to get cardiac clearance. Clearance given for this procedure, but if major surgery is in the future she would need to be fully worked up - per Dr. Virgina Jock. Hx of CAD, no stents.  April states pt is not diabetic.   Covid test to be done on arrival.

## 2021-06-04 ENCOUNTER — Ambulatory Visit (HOSPITAL_COMMUNITY): Payer: Medicare Other | Admitting: Physician Assistant

## 2021-06-04 ENCOUNTER — Ambulatory Visit (HOSPITAL_COMMUNITY)
Admission: RE | Admit: 2021-06-04 | Discharge: 2021-06-04 | Disposition: A | Discharge status: Home / Self Care | Payer: Medicare Other | Source: Ambulatory Visit | Attending: Otolaryngology | Admitting: Otolaryngology

## 2021-06-04 ENCOUNTER — Other Ambulatory Visit (HOSPITAL_COMMUNITY): Payer: Self-pay

## 2021-06-04 ENCOUNTER — Encounter (HOSPITAL_BASED_OUTPATIENT_CLINIC_OR_DEPARTMENT_OTHER): Payer: Self-pay

## 2021-06-04 ENCOUNTER — Encounter (HOSPITAL_COMMUNITY)
Admission: RE | Disposition: A | Discharge status: Home / Self Care | Payer: Self-pay | Source: Ambulatory Visit | Attending: Otolaryngology

## 2021-06-04 ENCOUNTER — Other Ambulatory Visit: Payer: Self-pay

## 2021-06-04 ENCOUNTER — Encounter (HOSPITAL_COMMUNITY): Payer: Self-pay | Admitting: Otolaryngology

## 2021-06-04 ENCOUNTER — Ambulatory Visit (HOSPITAL_BASED_OUTPATIENT_CLINIC_OR_DEPARTMENT_OTHER): Payer: Medicare Other | Admitting: Physician Assistant

## 2021-06-04 DIAGNOSIS — F32A Depression, unspecified: Secondary | ICD-10-CM | POA: Insufficient documentation

## 2021-06-04 DIAGNOSIS — C320 Malignant neoplasm of glottis: Secondary | ICD-10-CM

## 2021-06-04 DIAGNOSIS — F17210 Nicotine dependence, cigarettes, uncomplicated: Secondary | ICD-10-CM | POA: Insufficient documentation

## 2021-06-04 DIAGNOSIS — D380 Neoplasm of uncertain behavior of larynx: Secondary | ICD-10-CM | POA: Diagnosis not present

## 2021-06-04 DIAGNOSIS — J387 Other diseases of larynx: Secondary | ICD-10-CM | POA: Diagnosis not present

## 2021-06-04 DIAGNOSIS — J449 Chronic obstructive pulmonary disease, unspecified: Secondary | ICD-10-CM | POA: Insufficient documentation

## 2021-06-04 DIAGNOSIS — F419 Anxiety disorder, unspecified: Secondary | ICD-10-CM | POA: Diagnosis not present

## 2021-06-04 DIAGNOSIS — F418 Other specified anxiety disorders: Secondary | ICD-10-CM | POA: Diagnosis not present

## 2021-06-04 DIAGNOSIS — Z20822 Contact with and (suspected) exposure to covid-19: Secondary | ICD-10-CM | POA: Diagnosis not present

## 2021-06-04 DIAGNOSIS — D020 Carcinoma in situ of larynx: Secondary | ICD-10-CM | POA: Diagnosis not present

## 2021-06-04 DIAGNOSIS — I2510 Atherosclerotic heart disease of native coronary artery without angina pectoris: Secondary | ICD-10-CM | POA: Diagnosis not present

## 2021-06-04 DIAGNOSIS — K219 Gastro-esophageal reflux disease without esophagitis: Secondary | ICD-10-CM | POA: Diagnosis not present

## 2021-06-04 DIAGNOSIS — Z79899 Other long term (current) drug therapy: Secondary | ICD-10-CM | POA: Insufficient documentation

## 2021-06-04 DIAGNOSIS — E782 Mixed hyperlipidemia: Secondary | ICD-10-CM

## 2021-06-04 DIAGNOSIS — I10 Essential (primary) hypertension: Secondary | ICD-10-CM | POA: Insufficient documentation

## 2021-06-04 DIAGNOSIS — R0989 Other specified symptoms and signs involving the circulatory and respiratory systems: Secondary | ICD-10-CM

## 2021-06-04 HISTORY — DX: Dyspnea, unspecified: R06.00

## 2021-06-04 HISTORY — DX: Pneumonia, unspecified organism: J18.9

## 2021-06-04 HISTORY — DX: Depression, unspecified: F32.A

## 2021-06-04 HISTORY — DX: Essential (primary) hypertension: I10

## 2021-06-04 HISTORY — PX: MICROLARYNGOSCOPY: SHX5208

## 2021-06-04 HISTORY — DX: Atherosclerotic heart disease of native coronary artery without angina pectoris: I25.10

## 2021-06-04 LAB — BASIC METABOLIC PANEL
Anion gap: 9 (ref 5–15)
BUN: 13 mg/dL (ref 8–23)
CO2: 26 mmol/L (ref 22–32)
Calcium: 8.4 mg/dL — ABNORMAL LOW (ref 8.9–10.3)
Chloride: 105 mmol/L (ref 98–111)
Creatinine, Ser: 0.56 mg/dL (ref 0.44–1.00)
GFR, Estimated: >60 mL/min (ref >60)
Glucose, Bld: 120 mg/dL — ABNORMAL HIGH (ref 70–99)
Potassium: 3.8 mmol/L (ref 3.5–5.1)
Sodium: 140 mmol/L (ref 135–145)

## 2021-06-04 LAB — CBC
HCT: 43 % (ref 36.0–46.0)
Hemoglobin: 14 g/dL (ref 12.0–15.0)
MCH: 28.7 pg (ref 26.0–34.0)
MCHC: 32.6 g/dL (ref 30.0–36.0)
MCV: 88.3 fL (ref 80.0–100.0)
Platelets: 214 10*3/uL (ref 150–400)
RBC: 4.87 MIL/uL (ref 3.87–5.11)
RDW: 14.4 % (ref 11.5–15.5)
WBC: 5.5 10*3/uL (ref 4.0–10.5)
nRBC: 0 % (ref 0.0–0.2)

## 2021-06-04 LAB — SARS CORONAVIRUS 2 BY RT PCR (HOSPITAL ORDER, PERFORMED IN ~~LOC~~ HOSPITAL LAB): SARS Coronavirus 2: NEGATIVE

## 2021-06-04 SURGERY — MICROLARYNGOSCOPY
Anesthesia: General | Site: Mouth

## 2021-06-04 SURGERY — MICROLARYNGOSCOPY
Anesthesia: General

## 2021-06-04 MED ORDER — LIDOCAINE 2% (20 MG/ML) 5 ML SYRINGE
INTRAMUSCULAR | Status: DC | PRN
  Administered 2021-06-04: 100 mg via INTRAVENOUS

## 2021-06-04 MED ORDER — ACETAMINOPHEN 10 MG/ML IV SOLN
1000.0000 mg | Freq: Once | INTRAVENOUS | Status: DC | PRN
  Administered 2021-06-04: 1000 mg via INTRAVENOUS

## 2021-06-04 MED ORDER — ONDANSETRON HCL 4 MG/2ML IJ SOLN
INTRAMUSCULAR | Status: DC | PRN
  Administered 2021-06-04: 4 mg via INTRAVENOUS

## 2021-06-04 MED ORDER — LIDOCAINE 2% (20 MG/ML) 5 ML SYRINGE
INTRAMUSCULAR | Status: AC
  Filled 2021-06-04: qty 10

## 2021-06-04 MED ORDER — FENTANYL CITRATE (PF) 100 MCG/2ML IJ SOLN
25.0000 ug | INTRAMUSCULAR | Status: DC | PRN
  Administered 2021-06-04: 25 ug via INTRAVENOUS

## 2021-06-04 MED ORDER — EPINEPHRINE HCL (NASAL) 0.1 % NA SOLN
NASAL | Status: DC | PRN
  Administered 2021-06-04: 1 mL via NASAL

## 2021-06-04 MED ORDER — ROCURONIUM BROMIDE 10 MG/ML (PF) SYRINGE
PREFILLED_SYRINGE | INTRAVENOUS | Status: DC | PRN
  Administered 2021-06-04: 30 mg via INTRAVENOUS

## 2021-06-04 MED ORDER — OXYMETAZOLINE HCL 0.05 % NA SOLN
NASAL | Status: DC | PRN
  Administered 2021-06-04: 1

## 2021-06-04 MED ORDER — EPINEPHRINE PF 1 MG/ML IJ SOLN
INTRAMUSCULAR | Status: AC
  Filled 2021-06-04: qty 1

## 2021-06-04 MED ORDER — CHLORHEXIDINE GLUCONATE 0.12 % MT SOLN
15.0000 mL | Freq: Once | OROMUCOSAL | Status: AC
  Administered 2021-06-04: 15 mL via OROMUCOSAL
  Filled 2021-06-04: qty 15

## 2021-06-04 MED ORDER — PREDNISOLONE SODIUM PHOSPHATE 15 MG/5ML PO SOLN
15.0000 mg | Freq: Every day | ORAL | 0 refills | Status: AC
  Filled 2021-06-04: qty 15, 3d supply, fill #0

## 2021-06-04 MED ORDER — DEXAMETHASONE SODIUM PHOSPHATE 10 MG/ML IJ SOLN
INTRAMUSCULAR | Status: AC
  Filled 2021-06-04: qty 1

## 2021-06-04 MED ORDER — ROCURONIUM BROMIDE 10 MG/ML (PF) SYRINGE
PREFILLED_SYRINGE | INTRAVENOUS | Status: AC
  Filled 2021-06-04: qty 30

## 2021-06-04 MED ORDER — FENTANYL CITRATE (PF) 250 MCG/5ML IJ SOLN
INTRAMUSCULAR | Status: AC
  Filled 2021-06-04: qty 5

## 2021-06-04 MED ORDER — DEXAMETHASONE SODIUM PHOSPHATE 10 MG/ML IJ SOLN
INTRAMUSCULAR | Status: DC | PRN
  Administered 2021-06-04: 8 mg via INTRAVENOUS

## 2021-06-04 MED ORDER — OXYCODONE HCL 10 MG PO TABS
10.0000 mg | ORAL_TABLET | Freq: Four times a day (QID) | ORAL | 0 refills | Status: AC | PRN
  Filled 2021-06-04: qty 28, 7d supply, fill #0

## 2021-06-04 MED ORDER — ACETAMINOPHEN 10 MG/ML IV SOLN
INTRAVENOUS | Status: AC
  Filled 2021-06-04: qty 100

## 2021-06-04 MED ORDER — SUGAMMADEX SODIUM 200 MG/2ML IV SOLN
INTRAVENOUS | Status: DC | PRN
  Administered 2021-06-04: 200 mg via INTRAVENOUS

## 2021-06-04 MED ORDER — OXYCODONE HCL 5 MG PO TABS
10.0000 mg | ORAL_TABLET | Freq: Once | ORAL | Status: AC
  Administered 2021-06-04: 10 mg via ORAL

## 2021-06-04 MED ORDER — ACETAMINOPHEN 500 MG PO TABS: 1000.0000 mg | ORAL_TABLET | Freq: Once | ORAL | Status: DC | PRN

## 2021-06-04 MED ORDER — ORAL CARE MOUTH RINSE: 15.0000 mL | Freq: Once | OROMUCOSAL | Status: AC

## 2021-06-04 MED ORDER — OXYCODONE HCL 5 MG PO TABS
ORAL_TABLET | ORAL | Status: AC
  Filled 2021-06-04: qty 2

## 2021-06-04 MED ORDER — FENTANYL CITRATE (PF) 250 MCG/5ML IJ SOLN
INTRAMUSCULAR | Status: DC | PRN
  Administered 2021-06-04: 50 ug via INTRAVENOUS

## 2021-06-04 MED ORDER — ACETAMINOPHEN 160 MG/5ML PO SOLN: 1000.0000 mg | Freq: Once | ORAL | Status: DC | PRN

## 2021-06-04 MED ORDER — ONDANSETRON HCL 4 MG/2ML IJ SOLN
INTRAMUSCULAR | Status: AC
  Filled 2021-06-04: qty 2

## 2021-06-04 MED ORDER — SUCCINYLCHOLINE CHLORIDE 200 MG/10ML IV SOSY
PREFILLED_SYRINGE | INTRAVENOUS | Status: DC | PRN
  Administered 2021-06-04: 100 mg via INTRAVENOUS

## 2021-06-04 MED ORDER — FENTANYL CITRATE (PF) 100 MCG/2ML IJ SOLN
INTRAMUSCULAR | Status: AC
  Filled 2021-06-04: qty 2

## 2021-06-04 MED ORDER — 0.9 % SODIUM CHLORIDE (POUR BTL) OPTIME
TOPICAL | Status: DC | PRN
  Administered 2021-06-04: 1000 mL

## 2021-06-04 MED ORDER — LACTATED RINGERS IV SOLN: INTRAVENOUS | Status: DC

## 2021-06-04 MED ORDER — PROPOFOL 10 MG/ML IV BOLUS
INTRAVENOUS | Status: DC | PRN
  Administered 2021-06-04: 100 mg via INTRAVENOUS
  Administered 2021-06-04: 50 mg via INTRAVENOUS

## 2021-06-04 MED ORDER — OXYMETAZOLINE HCL 0.05 % NA SOLN
NASAL | Status: AC
  Filled 2021-06-04: qty 30

## 2021-06-04 SURGICAL SUPPLY — 35 items
BAG COUNTER SPONGE SURGICOUNT (BAG) ×3 IMPLANT
BAG SPNG CNTER NS LX DISP (BAG) ×1
BALLN PULM 15 16.5 18X75 (BALLOONS)
BALLOON PULM 15 16.5 18X75 (BALLOONS) IMPLANT
CANISTER SUCT 3000ML PPV (MISCELLANEOUS) ×3 IMPLANT
CNTNR URN SCR LID CUP LEK RST (MISCELLANEOUS) IMPLANT
CONT SPEC 4OZ STRL OR WHT (MISCELLANEOUS) ×2
COVER BACK TABLE 60X90IN (DRAPES) ×3 IMPLANT
COVER MAYO STAND STRL (DRAPES) ×3 IMPLANT
COVER SURGICAL LIGHT HANDLE (MISCELLANEOUS) ×1 IMPLANT
DRAPE HALF SHEET 40X57 (DRAPES) ×3 IMPLANT
DRSG TELFA 3X8 NADH (GAUZE/BANDAGES/DRESSINGS) ×4 IMPLANT
GAUZE 4X4 16PLY ~~LOC~~+RFID DBL (SPONGE) ×4 IMPLANT
GLOVE SURG ENC MOIS LTX SZ6.5 (GLOVE) ×3 IMPLANT
GLOVE SURG PR MICRO ENCORE 7 (GLOVE) ×1 IMPLANT
GOWN STRL REUS W/ TWL LRG LVL3 (GOWN DISPOSABLE) IMPLANT
GOWN STRL REUS W/TWL LRG LVL3 (GOWN DISPOSABLE)
GUARD TEETH (MISCELLANEOUS) ×3 IMPLANT
KIT TURNOVER KIT B (KITS) ×3 IMPLANT
NDL HYPO 25GX1X1/2 BEV (NEEDLE) IMPLANT
NDL TRANS ORAL INJECTION (NEEDLE) IMPLANT
NEEDLE HYPO 25GX1X1/2 BEV (NEEDLE) IMPLANT
NEEDLE TRANS ORAL INJECTION (NEEDLE) IMPLANT
NS IRRIG 1000ML POUR BTL (IV SOLUTION) ×3 IMPLANT
PAD ARMBOARD 7.5X6 YLW CONV (MISCELLANEOUS) ×6 IMPLANT
PAD DRESSING TELFA 3X8 NADH (GAUZE/BANDAGES/DRESSINGS) IMPLANT
PATTIES SURGICAL .5 X.5 (GAUZE/BANDAGES/DRESSINGS) ×1 IMPLANT
PATTIES SURGICAL .5 X3 (DISPOSABLE) IMPLANT
POSITIONER HEAD DONUT 9IN (MISCELLANEOUS) IMPLANT
SOL ANTI FOG 6CC (MISCELLANEOUS) IMPLANT
SOLUTION ANTI FOG 6CC (MISCELLANEOUS)
SURGILUBE 2OZ TUBE FLIPTOP (MISCELLANEOUS) IMPLANT
SYR 5ML LL (SYRINGE) ×1 IMPLANT
TOWEL GREEN STERILE FF (TOWEL DISPOSABLE) ×6 IMPLANT
TUBE CONNECTING 12X1/4 (SUCTIONS) ×3 IMPLANT

## 2021-06-04 NOTE — Discharge Instructions (Signed)
[facility] POST OP INSTRUCTIONS: LARYNGOSCOPY  The Surgery Itself Laryngoscopy with biopsy or injection involves a brief general anesthesia, typically for  less than one hour. Patients may be sedated for several hours after surgery and may  remain sleepy for the better part of the day. Nausea and vomiting are occasionally seen,  and usually resolve by the evening of surgery - even without additional medications.  Almost all patients can go home the day of surgery.  After Surgery ? You will have a sore throat from the metal instruments used to allow a good view  of your voice box for 3-5 days after surgery. Some patients may have sores on  the tongue or in the mouth. This is normal and you will be given pain  medications for this. If you have sores in the mouth, avoid citrus or acidic  foods/drinks since they will burn.  ? Avoid coughing or frequent throat clearing. Drinking water can help alleviate the  urge to clear the throat. ? Avoid any heavy lifting (more than 20 lbs), straining, exercise, or sports activities  for 2 weeks after surgery. ? Avoid alcohol, tobacco products, spicy foods, or eating late at night as these may  cause heartburn or stomach reflux and may delay the healing process. ? Your voice may be hoarse after surgery from swelling of the vocal cords caused  by manipulation. ? You do not have to avoid talking unless your surgeon gives you specific  instructions. Use your normal voice since whispering is harder on your vocal  cords then talking at a regular volume. ? If your surgeon advises voice rest, you should do the following: o You are to have absolute voice rest for 7 days. You may want to purchase  a dry erase board to communicate during this time. After that, you may  begin to use your voice at a soft spoken level. o You should only speak loud enough for people to hear you that are within  an arm's length away. Do not yell or whisper. You may increase your   voice use by 5 minutes/hour each day after the first 7 days of rest.  Medications ? Pain medication can be used for pain as prescribed. Pain in the throat and the  tongue is normal. ? Some patients will be given steroids or acid reducing medications after surgery,  take these as directed. ? Take all of your routine medications as prescribed, unless told otherwise by your  surgeon. Any medications that thin the blood should be avoided unless approved  by your surgeon.  ? IT IS OK TO TAKE OVER THE COUNTER PAIN MEDICATION  (IBUPROFEN, NAPROXEN, or ACETAMINOPHEN) IN ADDITION TO  YOUR PRESCRIBED MEDICATIONS. DO NOT TAKE ASPIRIN UNLESS  CLEARED WITH YOUR SURGEON.  ? Limit Acetaminophen/Tylenol to less than 4,000mg/day  ? Limit Ibuprofen/Motrin to less than 3,600mg/day  Final Result ? Following vocal cord injection, the voice may seem "strangled" due to swelling for  a few days or weeks. This is normal.  ? If you have a biopsy in surgery, you will usually find out the pathology results  when you are seen in the office for follow up. ? Many patients benefit from voice therapy after surgery to improve the long term  result. Your surgeon will recommend this if you could benefit from it  

## 2021-06-04 NOTE — Op Note (Signed)
OPERATIVE NOTE ? ?[name] Date/Time of Admission: 06/04/2021  6:55 AM  ?CSN: [id number];UUE:[id number] Attending Provider: Jason Coop, DO ?Room/Bed: MCPO/NONE DOB: Jul 14, 1940 Age: 81 y.o. ? ? ?Pre-Op Diagnosis: ?Laryngeal Mass ? ?Post-Op Diagnosis: ?Laryngeal Mass. ? ?Procedure: ?Procedure(s): ?MICROLARYNGOSCOPY WITH BIOPSY ? ?Anesthesia: ?General ? ?Surgeon(s): ?[place], DO ? ?Staff: ?Circulator: Redge Gainer, RN; Wynn Banker, RN ?Scrub Person: Kennieth Francois, RN ?Circulator Assistant: Maurene Capes, RN; Merry Proud, RN ? ?Implants: ?* No implants in log * ? ?Specimens: ?ID Type Source Tests Collected by Time Destination  ?1 : Right True Vocal Lesions Tissue PATH Soft tissue SURGICAL PATHOLOGY Anayely Constantine A, DO 06/04/2021 1010   ?2 : left True Vocal Lesions Tissue PATH Soft tissue SURGICAL PATHOLOGY Tarah Buboltz A, DO 06/04/2021 1059   ? ? ?Complications: ?None ? ?EBL: ?<5 ML ? ?Condition: ?stable ? ?Operative Findings:  ?Exophytic lesion of bilateral true vocal folds, no subglottic extension, no obvious involvement of supraglottis. Frozen section is consistent with Squamous Cell Carcinoma, Clinically staged [id number] ? ?Description of Operation: ? ?Once operative consent was obtained, and the surgical site confirmed with the operating room team, the patient was brought back to the operating room and general endotracheal anesthesia was obtained. The patient was turned over to the ENT service. An operating laryngoscope was used to directly visualize the upper airway and glottis. All anatomic areas from the oral cavity to the glottis were examined and noted to be normal with only exceptions noted in this report. Areas examined included the oropharynx, vallecula, both surfaces of the epiglottis, glottis, post cricoid region and bilateral pyriform sinuses.   The patient was placed in laryngeal suspension with the focus on the glottis with the ET tube intact. An operating  microscope was used to visualize the vocal cords and biopsies of the bilateral true cords were performed, taking care to take biopsies from not directly opposing areas. Specimens were sent to pathology as a frozen section. Hemostasis was obtained with an afrin soaked pledget. An oral gastric tube was placed into the stomach and suctioned to reduce postoperative nausea. The patient was turned back over to the anesthesia service. The patient was transferred to the PACU in stable condition.  ? ?[place], DO ?[facility]  ?06/04/2021   ? ?

## 2021-06-04 NOTE — Anesthesia Preprocedure Evaluation (Signed)
Anesthesia Evaluation  ?Patient identified by MRN, date of birth, ID band ?Patient awake ? ? ? ?Reviewed: ?Allergy & Precautions, NPO status , Patient's Chart, lab work & pertinent test results ? ?History of Anesthesia Complications ?Negative for: history of anesthetic complications ? ?Airway ?Mallampati: III ? ?TM Distance: >3 FB ?Neck ROM: Full ? ? ? Dental ? ?(+) Edentulous Upper, Edentulous Lower, Dental Advisory Given ?  ?Pulmonary ?shortness of breath, COPD, Current SmokerPatient did not abstain from smoking.,  ?  ? ?+ wheezing ? ? ? ? ? Cardiovascular ?hypertension, Pt. on medications ?(-) angina+ CAD  ?(-) Past MI and (-) CHF  ?Rhythm:Regular  ? ?  ?Neuro/Psych ?PSYCHIATRIC DISORDERS Anxiety Depression negative neurological ROS ?   ? GI/Hepatic ?Neg liver ROS, hiatal hernia, GERD  Medicated and Controlled,  ?Endo/Other  ?negative endocrine ROS ? Renal/GU ?negative Renal ROSLab Results ?     Component                Value               Date                 ?     CREATININE               0.56                06/04/2021           ?  ? ?  ?Musculoskeletal ? ?(+) Arthritis ,  ? Abdominal ?  ?Peds ? Hematology ?negative hematology ROS ?(+) Lab Results ?     Component                Value               Date                 ?     WBC                      5.5                 06/04/2021           ?     HGB                      14.0                06/04/2021           ?     HCT                      43.0                06/04/2021           ?     MCV                      88.3                06/04/2021           ?     PLT                      214                 06/04/2021           ?   ?  Anesthesia Other Findings ? ? Reproductive/Obstetrics ? ?  ? ? ? ? ? ? ? ? ? ? ? ? ? ?  ?  ? ? ? ? ? ? ? ? ?Anesthesia Physical ?Anesthesia Plan ? ?ASA: 3 ? ?Anesthesia Plan: General  ? ?Post-op Pain Management: Minimal or no pain anticipated  ? ?Induction: Intravenous ? ?PONV Risk Score and Plan: 2 and  Dexamethasone ? ?Airway Management Planned: Oral ETT ? ?Additional Equipment: None ? ?Intra-op Plan:  ? ?Post-operative Plan: Extubation in OR ? ?Informed Consent: I have reviewed the patients History and Physical, chart, labs and discussed the procedure including the risks, benefits and alternatives for the proposed anesthesia with the patient or authorized representative who has indicated his/her understanding and acceptance.  ? ? ? ?Dental advisory given ? ?Plan Discussed with: CRNA and Anesthesiologist ? ?Anesthesia Plan Comments:   ? ? ? ? ? ? ?Anesthesia Quick Evaluation ? ?

## 2021-06-04 NOTE — Transfer of Care (Signed)
Immediate Anesthesia Transfer of Care Note ? ?Patient: Elizabeth Velazquez ? ?Procedure(s) Performed: MICROLARYNGOSCOPY (Mouth) ? ?Patient Location: PACU ? ?Anesthesia Type:General ? ?Level of Consciousness: awake, alert  and oriented ? ?Airway & Oxygen Therapy: Patient Spontanous Breathing and Patient connected to nasal cannula oxygen ? ?Post-op Assessment: Report given to RN, Post -op Vital signs reviewed and stable and Patient moving all extremities ? ?Post vital signs: Reviewed and stable ? ?Last Vitals:  ?Vitals Value Taken Time  ?BP 142/86 06/04/21 1125  ?Temp 36.6 ?C 06/04/21 1125  ?Pulse 90 06/04/21 1131  ?Resp 11 06/04/21 1131  ?SpO2 100 % 06/04/21 1131  ?Vitals shown include unvalidated device data. ? ?Last Pain:  ?Vitals:  ? 06/04/21 0803  ?TempSrc:   ?PainSc: 0-No pain  ?   ? ?  ? ?Complications: No notable events documented. ?

## 2021-06-04 NOTE — H&P (Signed)
Elizabeth Velazquez is an 81 y.o. female.    Chief Complaint:  Hoarseness, neoplasm of larynx  HPI: Patient presents today for planned elective procedure.  She denies any interval change in history since office visit on 05/12/2021. She has been seen by cardiology and pulmonology and cleared for surgery today.  Last visit 05/12/2021:Elizabeth Velazquez is a 81 y.o. female who presents as a new consult, referred by Sofie Rower, [place], for evaluation and treatment of poor vocal quality. Patient states that she has not had a normal voice since sometime in the summer. She is unable to identify any triggering event prior to the onset of her symptoms. She denies any exacerbating or alleviating factors. She states that her vocal quality waxes and wanes, but has never returned to her baseline since initial decline in the summertime months. She endorses history of GERD, and currently takes a daily PPI. She is a 2 pack/day smoker, with approximate 40-pack-year history. She denies dysphagia, odynophagia, choking with oral intake, fevers, chills, unintentional weight loss. She has not had any recent imaging of her head or chest, but states that she is scheduled for CT chest with contrast tomorrow.  Past Medical History:  Diagnosis Date   Anxiety    Arthritis    COPD (chronic obstructive pulmonary disease) ([place])    Coronary artery disease    Depression    Dysphagia    Dyspnea    GERD (gastroesophageal reflux disease)    History of hiatal hernia    Hyperlipidemia    Hypertension    Pneumonia    Shoulder fracture, left    Skin cancer    Wears dentures    Wears glasses     Past Surgical History:  Procedure Laterality Date   ABDOMINAL HYSTERECTOMY     BALLOON DILATION N/A 12/16/2018   Procedure: BALLOON DILATION;  Surgeon: Clarene Essex, MD;  Location: [facility];  Service: Endoscopy;  Laterality: N/A;   COLONOSCOPY     ESOPHAGOGASTRODUODENOSCOPY (EGD) WITH PROPOFOL N/A 12/16/2018   Procedure:  ESOPHAGOGASTRODUODENOSCOPY (EGD) WITH PROPOFOL, POSSIBLE BALLOON DILATION;  Surgeon: Clarene Essex, MD;  Location: [facility];  Service: Endoscopy;  Laterality: N/A;   HEMORRHOID SURGERY     UPPER GI ENDOSCOPY      Family History  Problem Relation Age of Onset   Stroke Mother    Alcohol abuse Father    Heart attack Father    Heart disease Father     Social History:  reports that she has been smoking cigarettes. She has a 130.00 pack-year smoking history. She has never used smokeless tobacco. She reports that she does not drink alcohol and does not use drugs.  Allergies:  Allergies  Allergen Reactions   Azithromycin     Other reaction(s): heart races   Lidocaine     Other reaction(s): Unknown   Penicillins     Did it involve swelling of the face/tongue/throat, SOB, or low BP? Unknown Did it involve sudden or severe rash/hives, skin peeling, or any reaction on the inside of your mouth or nose? Unknown Did you need to seek medical attention at a hospital or doctor's office? Unknown When did it last happen?      childhood If all above answers are NO, may proceed with cephalosporin use.     Medications Prior to Admission  Medication Sig Dispense Refill   alendronate (FOSAMAX) 70 MG tablet Take 70 mg by mouth once a week. Take with a full glass of water on an empty stomach.  amLODipine (NORVASC) 5 MG tablet TAKE 1 TABLET(5 MG) BY MOUTH DAILY 90 tablet 1   aspirin EC 81 MG tablet Take 1 tablet (81 mg total) by mouth daily. 90 tablet 3   atorvastatin (LIPITOR) 20 MG tablet Take 20 mg by mouth daily.     baclofen (LIORESAL) 10 MG tablet Take 10 mg by mouth 3 (three) times daily as needed for muscle spasms.      Cholecalciferol (VITAMIN D3) 125 MCG (5000 UT) TABS Take 5,000 Units by mouth daily.     docusate sodium (COLACE) 100 MG capsule Take 100 mg by mouth at bedtime as needed for mild constipation.     gabapentin (NEURONTIN) 400 MG capsule Take 400 mg by mouth 3 (three) times  daily.     HYDROcodone-acetaminophen (NORCO) 10-325 MG tablet Take 1 tablet by mouth in the morning, at noon, in the evening, and at bedtime.     ibuprofen (ADVIL) 200 MG tablet Take 400 mg by mouth 2 (two) times daily.     omeprazole (PRILOSEC) 40 MG capsule 40 mg 2 (two) times daily.     OXYGEN Inhale 3.5 L into the lungs daily as needed (Low oxygen).     senna-docusate (SENOKOT-S) 8.6-50 MG tablet Take 2 tablets by mouth at bedtime as needed for mild constipation.     SYMBICORT 160-4.5 MCG/ACT inhaler Inhale 2 puffs into the lungs daily as needed (Breathing/COPD/emphysema).     Tiotropium Bromide Monohydrate (SPIRIVA RESPIMAT) 2.5 MCG/ACT AERS Inhale 2 puffs into the lungs daily. (Patient taking differently: Inhale 1 puff into the lungs daily.) 8 g 0   TURMERIC PO Take 1 tablet by mouth daily as needed (If feel like getting sick).     venlafaxine XR (EFFEXOR-XR) 150 MG 24 hr capsule Take 150 mg by mouth daily with breakfast.     zolpidem (AMBIEN) 5 MG tablet Take 5 mg by mouth at bedtime.     albuterol (PROVENTIL) (2.5 MG/3ML) 0.083% nebulizer solution Take 2.5 mg by nebulization every 6 (six) hours as needed for shortness of breath.      nicotine (NICODERM CQ - DOSED IN MG/24 HOURS) 14 mg/24hr patch Place 1 patch (14 mg total) onto the skin daily. 28 patch 2   nicotine polacrilex (NICORETTE) 2 MG gum Take 1 each (2 mg total) by mouth as needed for smoking cessation. 100 tablet 2    Results for orders placed or performed during the hospital encounter of 06/04/21 (from the past 48 hour(s))  SARS Coronavirus 2 by RT PCR (hospital order, performed in [facility] hospital lab) Nasopharyngeal Nasopharyngeal Swab     Status: None   Collection Time: 06/04/21  7:04 AM   Specimen: Nasopharyngeal Swab  Result Value Ref Range   SARS Coronavirus 2 NEGATIVE NEGATIVE    Comment: (NOTE) SARS-CoV-2 target nucleic acids are NOT DETECTED.  The SARS-CoV-2 RNA is generally detectable in upper and  lower respiratory specimens during the acute phase of infection. The lowest concentration of SARS-CoV-2 viral copies this assay can detect is 250 copies / mL. A negative result does not preclude SARS-CoV-2 infection and should not be used as the sole basis for treatment or other patient management decisions.  A negative result may occur with improper specimen collection / handling, submission of specimen other than nasopharyngeal swab, presence of viral mutation(s) within the areas targeted by this assay, and inadequate number of viral copies (<250 copies / mL). A negative result must be combined with clinical observations, patient history, and  epidemiological information.  Fact Sheet for Patients:   StrictlyIdeas.no  Fact Sheet for Healthcare Providers: BankingDealers.co.za  This test is not yet approved or  cleared by the [place] FDA and has been authorized for detection and/or diagnosis of SARS-CoV-2 by FDA under an Emergency Use Authorization (EUA).  This EUA will remain in effect (meaning this test can be used) for the duration of the COVID-19 declaration under Section 564(b)(1) of the Act, 21 U.S.C. section 360bbb-3(b)(1), unless the authorization is terminated or revoked sooner.  Performed at [facility], [place] [address], [place], [place] [address]   CBC per protocol     Status: None   Collection Time: 06/04/21  7:52 AM  Result Value Ref Range   WBC 5.5 4.0 - 10.5 K/uL   RBC 4.87 3.87 - 5.11 MIL/uL   Hemoglobin 14.0 12.0 - 15.0 g/dL   HCT 43.0 36.0 - 46.0 %   MCV 88.3 80.0 - 100.0 fL   MCH 28.7 26.0 - 34.0 pg   MCHC 32.6 30.0 - 36.0 g/dL   RDW 14.4 11.5 - 15.5 %   Platelets 214 150 - 400 K/uL   nRBC 0.0 0.0 - 0.2 %    Comment: Performed at [facility], [place] [address], [place], [place] [address]   No results found.  ROS: ROS  Blood pressure 140/70, pulse 96, temperature 98.7 F (37.1 C),  temperature source Oral, resp. rate 17, height 4\' 9"  (1.448 m), weight 48.5 kg, SpO2 95 %.  PHYSICAL EXAM: Physical Exam  Studies Reviewed: CT chest results reviewed   Assessment/Plan Elizabeth Velazquez is a 81 y.o. female with history of GERD, COPD, extensive tobacco use history with approximately 93-month history of hoarseness. Patient denies dysphagia, odynophagia, worsening shortness of breath, unintentional weight loss, fevers, chills, night sweats. Flexible nasolaryngoscopy performed in the office demonstrated exophytic lesion of bilateral true vocal folds with involvement of anterior commissure concerning for malignant neoplasm. No palpable adenopathy on examination. - To OR for microlaryngoscopy and biopsy today. Risks of surgery were reviewed with patient.  All of patient's questions were answered.    Elizabeth Velazquez A Gerard Bonus 06/04/2021, 8:37 AM

## 2021-06-04 NOTE — Anesthesia Postprocedure Evaluation (Signed)
Anesthesia Post Note ? ?Patient: Elizabeth Velazquez ? ?Procedure(s) Performed: MICROLARYNGOSCOPY (Mouth) ? ?  ? ?Patient location during evaluation: PACU ?Anesthesia Type: General ?Level of consciousness: awake and alert, oriented and patient cooperative ?Pain management: pain level controlled ?Vital Signs Assessment: post-procedure vital signs reviewed and stable ?Respiratory status: spontaneous breathing, nonlabored ventilation and respiratory function stable ?Cardiovascular status: blood pressure returned to baseline and stable ?Postop Assessment: no apparent nausea or vomiting ?Anesthetic complications: no ? ? ?No notable events documented. ? ?Last Vitals:  ?Vitals:  ? 06/04/21 1255 06/04/21 1310  ?BP: 123/62 118/61  ?Pulse: 81 83  ?Resp: 14 15  ?Temp:  36.6 ?C  ?SpO2: 91% 93%  ?  ?Last Pain:  ?Vitals:  ? 06/04/21 1310  ?TempSrc:   ?PainSc: 3   ? ? ?  ?  ?  ?  ?  ?  ? ?Jarome Matin [name] ? ? ? ? ?

## 2021-06-05 ENCOUNTER — Encounter (HOSPITAL_COMMUNITY): Payer: Self-pay | Admitting: Otolaryngology

## 2021-06-06 LAB — SURGICAL PATHOLOGY

## 2021-06-10 ENCOUNTER — Other Ambulatory Visit: Payer: Medicare Other

## 2021-06-18 ENCOUNTER — Encounter: Payer: Self-pay | Admitting: Radiation Oncology

## 2021-06-18 ENCOUNTER — Other Ambulatory Visit: Payer: Self-pay

## 2021-06-18 DIAGNOSIS — C320 Malignant neoplasm of glottis: Secondary | ICD-10-CM

## 2021-06-18 NOTE — Progress Notes (Signed)
T

## 2021-06-19 ENCOUNTER — Other Ambulatory Visit: Payer: Self-pay | Admitting: *Deleted

## 2021-06-19 ENCOUNTER — Ambulatory Visit: Payer: Medicare Other

## 2021-06-19 ENCOUNTER — Other Ambulatory Visit: Payer: Self-pay

## 2021-06-19 DIAGNOSIS — I2510 Atherosclerotic heart disease of native coronary artery without angina pectoris: Secondary | ICD-10-CM

## 2021-06-19 DIAGNOSIS — I6523 Occlusion and stenosis of bilateral carotid arteries: Secondary | ICD-10-CM

## 2021-06-19 DIAGNOSIS — R0989 Other specified symptoms and signs involving the circulatory and respiratory systems: Secondary | ICD-10-CM

## 2021-06-19 NOTE — Progress Notes (Signed)
The proposed treatment discussed in cancer conference is for discussion purpose only and is not a binding recommendation. The patient was not physically examined nor present for their treatment options. Therefore, final treatment plans cannot be decided.  ?

## 2021-06-20 ENCOUNTER — Other Ambulatory Visit: Payer: Self-pay | Admitting: Cardiology

## 2021-06-20 ENCOUNTER — Ambulatory Visit (HOSPITAL_COMMUNITY)
Admission: RE | Admit: 2021-06-20 | Discharge: 2021-06-20 | Disposition: A | Discharge status: Home / Self Care | Payer: Medicare Other | Source: Ambulatory Visit | Attending: Radiation Oncology | Admitting: Radiation Oncology

## 2021-06-20 DIAGNOSIS — R221 Localized swelling, mass and lump, neck: Secondary | ICD-10-CM | POA: Diagnosis not present

## 2021-06-20 DIAGNOSIS — C320 Malignant neoplasm of glottis: Secondary | ICD-10-CM | POA: Insufficient documentation

## 2021-06-20 DIAGNOSIS — I10 Essential (primary) hypertension: Secondary | ICD-10-CM

## 2021-06-20 DIAGNOSIS — K110 Atrophy of salivary gland: Secondary | ICD-10-CM | POA: Diagnosis not present

## 2021-06-20 DIAGNOSIS — I700 Atherosclerosis of aorta: Secondary | ICD-10-CM | POA: Diagnosis not present

## 2021-06-20 MED ORDER — IOHEXOL 300 MG/ML  SOLN
75.0000 mL | Freq: Once | INTRAMUSCULAR | Status: AC | PRN
  Administered 2021-06-20: 75 mL via INTRAVENOUS

## 2021-06-23 ENCOUNTER — Ambulatory Visit: Payer: Medicare Other

## 2021-06-23 ENCOUNTER — Other Ambulatory Visit: Payer: Self-pay

## 2021-06-23 DIAGNOSIS — C320 Malignant neoplasm of glottis: Secondary | ICD-10-CM | POA: Diagnosis not present

## 2021-06-23 DIAGNOSIS — I2510 Atherosclerotic heart disease of native coronary artery without angina pectoris: Secondary | ICD-10-CM

## 2021-06-23 NOTE — Progress Notes (Incomplete)
?Radiation Oncology         [phone] ?________________________________ ? ?Initial Outpatient Consultation ? ?Name: Elizabeth Velazquez MRN: [id number]  ?Date: 06/24/2021  DOB: 26-Jul-1940 ? ?[id number], [name], FNP  Skotnicki, Meghan A, DO  ? ?REFERRING PHYSICIAN: Skotnicki, Meghan A, DO ? ?DIAGNOSIS: No diagnosis found. ? ? 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 ?CHIEF COMPLAINT: Here to discuss management of glottic cancer ? ?HISTORY OF PRESENT ILLNESS::Elizabeth Velazquez is a 81 y.o. female who initially presented with changes in her voice to her PCP, Magda Kiel (unknown date). Patient reported that she first noticed changes in her voice sometime in the Summer of 2022, and denied any triggering event that lead to this, or exacerbating or alleviating factors.  ? ?Subsequently, the patient saw Dr. Fredric Dine on 05/12/21 who performed a laryngoscopy which revealed a exophytic lesion about the bilateral true vocal folds with involvement of anterior commissure concerning for malignant neoplasm. No palpable adenopathy was appreciated on examination. Accordingly, Dr. Fredric Dine recommended further work-up consisting of microlaryngoscopy and biopsies of the lesion. ? 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  ? ?The patient also had a recent chest CT scan performed on 05/14/21 which showed a new 6.5 mm left upper lobe nodule with a spiculated border, highly suspicious for primary bronchogenic ?malignancy.  Other pulmonary nodules were also appreciated, but appeared stable from prior examination. Moderate emphysematous changes and bronchial wall thickening were also appreciated. ? ?Of note: the patient's case was discussed at the tumor board held on 06/19/21.  ? ?Pertinent imaging thus far includes a soft tissue neck CT performed on 06/20/21 revealing asymmetric left glottic thickening consistent with the patient's recent diagnosis of carcinoma. A 1 cm midline nodule was also appreciated, noted as most consistent with a dermal inclusion cyst ?given it's location. CT otherwise showed not evidence of adenopathy.  ? ?Swallowing issues, if any: None (denies dysphagia, odynophagia, or choking with oral intake) ? ?Weight Changes: none  ? ?Pain status: body and back pain (as below) ? ?Other symptoms: Significant shortness of breath especially with exertion, intermittent wheezing, daily cough with sputum production, feeling generally tired, and body/back pain that limits her daily activity..  She does have a nebulizer machine at home and is not using it much.  She is using Symbicort 160-4.5 MCG 1 puff in the morning and another puff around midday (followed by pulmonology, Dr. Erin Fulling)  ? ?Tobacco history, if any: 1 to 2 packs/day and has been smoking for 65 years (continues to smoke on a daily basis) ? ?ETOH abuse, if any: does not drink  ? ?Prior cancers, if any: skin cancer ? ?PREVIOUS RADIATION THERAPY: {EXAM; YES/NO:19492::"No"} ? ?PAST MEDICAL HISTORY:  has a past medical history of Anxiety, Arthritis, COPD (chronic obstructive pulmonary disease) ([place]), Coronary artery disease, Depression, Dysphagia, Dyspnea, GERD (gastroesophageal reflux disease), History of hiatal hernia, Hyperlipidemia, Hypertension, Pneumonia, Shoulder fracture, left, Skin cancer, Wears dentures, and Wears glasses.   ? ?PAST SURGICAL HISTORY: ?Past Surgical History:  ?Procedure Laterality Date  ? ABDOMINAL HYSTERECTOMY    ?  BALLOON DILATION N/A  12/16/2018  ? Procedure: BALLOON DILATION;  Surgeon: Clarene Essex, MD;  Location: [facility];  Service: Endoscopy;  Laterality: N/A;  ? COLONOSCOPY    ? ESOPHAGOGASTRODUODENOSCOPY (EGD) WITH PROPOFOL N/A 12/16/2018  ? Procedure: ESOPHAGOGASTRODUODENOSCOPY (EGD) WITH PROPOFOL, POSSIBLE BALLOON DILATION;  Surgeon: Clarene Essex, MD;  Location: [facility];  Service: Endoscopy;  Laterality: N/A;  ? HEMORRHOID SURGERY    ? MICROLARYNGOSCOPY N/A 06/04/2021  ? Procedure: MICROLARYNGOSCOPY;  Surgeon: Jason Coop, DO;  Location: [facility];  Service: ENT;  Laterality: N/A;  ? UPPER GI ENDOSCOPY    ? ? ?FAMILY HISTORY: family history includes Alcohol abuse in her father; Heart attack in her father; Heart disease in her father; Stroke in her mother. ? ?SOCIAL HISTORY:  reports that she has been smoking cigarettes. She has a 130.00 pack-year smoking history. She has never used smokeless tobacco. She reports that she does not drink alcohol and does not use drugs. ? ?ALLERGIES: Azithromycin, Lidocaine, and Penicillins ? ?MEDICATIONS:  ?Current Outpatient Medications  ?Medication Sig Dispense Refill  ? albuterol (PROVENTIL) (2.5 MG/3ML) 0.083% nebulizer solution Take 2.5 mg by nebulization every 6 (six) hours as needed for shortness of breath.     ? alendronate (FOSAMAX) 70 MG tablet Take 70 mg by mouth once a week. Take with a full glass of water on an empty stomach.    ? amLODipine (NORVASC) 5 MG tablet TAKE 1 TABLET(5 MG) BY MOUTH DAILY 90 tablet 1  ? aspirin EC 81 MG tablet Take 1 tablet (81 mg total) by mouth daily. 90 tablet 3  ? atorvastatin (LIPITOR) 20 MG tablet Take 20 mg by mouth daily.    ? baclofen (LIORESAL) 10 MG tablet Take 10 mg by mouth 3 (three) times daily as needed for muscle spasms.     ? Cholecalciferol (VITAMIN D3) 125 MCG (5000 UT) TABS Take 5,000 Units by mouth daily.    ? docusate sodium (COLACE) 100 MG capsule Take 100 mg by mouth at bedtime as needed for mild constipation.    ? gabapentin (NEURONTIN)  400 MG capsule Take 400 mg by mouth 3 (three) times daily.    ? ibuprofen (ADVIL) 200 MG tablet Take 400 mg by mouth 2 (two) times daily.    ? nicotine (NICODERM CQ - DOSED IN MG/24 HOURS) 14 mg/24hr patch Place 1 patch (14 mg total) onto the skin daily. 28 patch 2  ? nicotine polacrilex (NICORETTE) 2 MG gum Take 1 each (2 mg total) by mouth as needed for smoking cessation. 100 tablet 2  ? omeprazole (PRILOSEC) 40 MG capsule 40 mg 2 (two) times daily.    ? OXYGEN Inhale 3.5 L into the lungs daily as needed (Low oxygen).    ? senna-docusate (SENOKOT-S) 8.6-50 MG tablet Take 2 tablets by mouth at bedtime as needed for mild constipation.    ? SYMBICORT 160-4.5 MCG/ACT inhaler Inhale 2 puffs into the lungs daily as needed (Breathing/COPD/emphysema).    ? Tiotropium Bromide Monohydrate (SPIRIVA RESPIMAT) 2.5 MCG/ACT AERS Inhale 2 puffs into the lungs daily. (Patient taking differently: Inhale 1 puff into the lungs daily.) 8 g 0  ? TURMERIC PO Take 1 tablet by mouth daily as needed (If feel like getting sick).    ? venlafaxine XR (EFFEXOR-XR) 150 MG 24 hr capsule Take 150 mg by mouth daily with breakfast.    ? zolpidem (AMBIEN) 5 MG tablet Take 5 mg by mouth at bedtime.    ? ?No current facility-administered medications  for this encounter.  ? ? ?REVIEW OF SYSTEMS:  Notable for that above. ?  ?PHYSICAL EXAM:  vitals were not taken for this visit.   ?General: Alert and oriented, in no acute distress ?HEENT: Head is normocephalic. Extraocular movements are intact. Oropharynx is notable for ***. ?Neck: Neck is notable for *** ?Heart: Regular in rate and rhythm with no murmurs, rubs, or gallops. ?Chest: Clear to auscultation bilaterally, with no rhonchi, wheezes, or rales. ?Abdomen: Soft, nontender, nondistended, with no rigidity or guarding. ?Extremities: No cyanosis or edema. ?Lymphatics: see Neck Exam ?Skin: No concerning lesions. ?Musculoskeletal: symmetric strength and muscle tone throughout. ?Neurologic: Cranial nerves  II through XII are grossly intact. No obvious focalities. Speech is fluent. Coordination is intact. ?Psychiatric: Judgment and insight are intact. Affect is appropriate. ? ? ?ECOG = *** ? ?0 - Asymptomatic (F

## 2021-06-24 ENCOUNTER — Other Ambulatory Visit: Payer: Self-pay

## 2021-06-24 ENCOUNTER — Ambulatory Visit
Admission: RE | Admit: 2021-06-24 | Discharge: 2021-06-24 | Disposition: A | Discharge status: Home / Self Care | Payer: Medicare Other | Source: Ambulatory Visit | Attending: Radiation Oncology | Admitting: Radiation Oncology

## 2021-06-24 ENCOUNTER — Ambulatory Visit: Payer: Medicare Other

## 2021-06-24 ENCOUNTER — Encounter: Payer: Self-pay | Admitting: Radiation Oncology

## 2021-06-24 VITALS — BP 151/84 | HR 100 | Temp 97.5°F | Resp 20 | Ht <= 58 in | Wt 111.0 lb

## 2021-06-24 DIAGNOSIS — C320 Malignant neoplasm of glottis: Secondary | ICD-10-CM

## 2021-06-24 DIAGNOSIS — Z510 Encounter for antineoplastic radiation therapy: Secondary | ICD-10-CM | POA: Diagnosis not present

## 2021-06-24 DIAGNOSIS — Z79899 Other long term (current) drug therapy: Secondary | ICD-10-CM | POA: Insufficient documentation

## 2021-06-24 DIAGNOSIS — F17210 Nicotine dependence, cigarettes, uncomplicated: Secondary | ICD-10-CM | POA: Diagnosis not present

## 2021-06-24 MED ORDER — NICOTINE 7 MG/24HR TD PT24: 7.0000 mg | MEDICATED_PATCH | Freq: Every day | TRANSDERMAL | 0 refills | Status: DC

## 2021-06-24 MED ORDER — NICOTINE 21 MG/24HR TD PT24: 21.0000 mg | MEDICATED_PATCH | Freq: Every day | TRANSDERMAL | 2 refills | Status: DC

## 2021-06-24 MED ORDER — BUPROPION HCL ER (SR) 150 MG PO TB12: ORAL_TABLET | ORAL | 2 refills | Status: DC

## 2021-06-24 MED ORDER — NICOTINE 14 MG/24HR TD PT24: 14.0000 mg | MEDICATED_PATCH | Freq: Every day | TRANSDERMAL | 0 refills | Status: DC

## 2021-06-24 MED ORDER — OXYMETAZOLINE HCL 0.05 % NA SOLN
2.0000 | Freq: Once | NASAL | Status: AC
  Administered 2021-06-24: 2 via NASAL
  Filled 2021-06-24: qty 30

## 2021-06-24 NOTE — Progress Notes (Addendum)
?Radiation Oncology         [phone] ?________________________________ ? ?Initial Outpatient Consultation ? ?Name: Elizabeth Velazquez MRN: [id number]  ?Date: 06/24/2021  DOB: 07/20/1940 ? ?XQ:JJHERDE, Elizabeth Reader, FNP  Velazquez, Elizabeth A, DO  ? ?REFERRING PHYSICIAN: Skotnicki, Elizabeth A, DO ? ?DIAGNOSIS:  ?  ICD-10-CM   ?1. Glottis carcinoma (HCC)  C32.0 nicotine (NICODERM CQ - DOSED IN MG/24 HOURS) 21 mg/24hr patch  ?  nicotine (NICODERM CQ - DOSED IN MG/24 HOURS) 14 mg/24hr patch  ?  nicotine (NICODERM CQ - DOSED IN MG/24 HR) 7 mg/24hr patch  ?  buPROPion (WELLBUTRIN SR) 150 MG 12 hr tablet  ?  oxymetazoline (AFRIN) 0.05 % nasal spray 2 spray  ?  Fiberoptic laryngoscopy  ?  ? ? ? Cancer Staging  ?Glottis carcinoma ([place]) ?Staging form: Larynx - Glottis, AJCC 8th Edition ?- Clinical stage from 06/18/2021: Stage I (cT1b, cN0, cM0) - Signed by Elizabeth Gibson, MD on 06/18/2021 ? 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 ?CHIEF COMPLAINT: Here to discuss management of glottic cancer ? ?HISTORY OF PRESENT ILLNESS::Elizabeth Velazquez is a 81 y.o. female who initially presented with changes in her voice to her PCP, Elizabeth Velazquez (unknown date). Patient reported that she first noticed changes in her voice sometime in the Summer of 2022, and denied any triggering event that lead to this, or exacerbating or alleviating factors.  ? ?Subsequently, the patient saw Elizabeth Velazquez on 05/12/21 who performed a laryngoscopy which revealed a exophytic lesion about the bilateral true vocal folds with involvement of anterior commissure concerning for malignant neoplasm. No palpable adenopathy was appreciated on examination. Accordingly, Elizabeth Velazquez recommended further work-up consisting of microlaryngoscopy and biopsies of the lesion.  ? ?Biopsy of the left true vocal cord on 06/04/21 revealed invasive well to  moderately differentiated squamous cell carcinoma. 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  ? ?The patient also had a recent chest CT scan performed on 05/14/21 which showed a new 6.5 mm left upper lobe nodule with a spiculated border, highly suspicious for primary bronchogenic malignancy. Other pulmonary nodules were also appreciated, but appeared stable from prior examination. Moderate emphysematous changes and bronchial wall thickening were also appreciated. A follow-up CT chest has been ordered for August. ? ?Of note: the patient's case was discussed at the tumor board held on 06/19/21.  Consensus recommendation for radiotherapy. ? ?Pertinent imaging thus far includes a soft tissue neck CT performed on 06/20/21 revealing asymmetric left glottic thickening consistent with the patient's recent diagnosis of carcinoma. A 1 cm midline nodule was also appreciated, noted as most consistent with a dermal inclusion cyst ?given its location. CT otherwise showed not evidence of adenopathy.  ? ?Swallowing issues, if any: None (denies dysphagia, odynophagia, or choking with oral intake) ? ?Weight Changes: 10lb loss  ? ?Pain status: body and back pain (as below) ? ?Other symptoms: Significant shortness of breath especially with exertion, intermittent wheezing, daily cough with sputum production, feeling generally tired, and body/back pain that limits her daily activity..  She does have a nebulizer machine at home and is not using it much.  She is using Symbicort 160-4.5 MCG 1 puff in the morning and another puff around midday (followed by pulmonology, Dr. Erin Velazquez)  ? ?Tobacco history, if any: 1 to 2 packs/day and has been smoking for 65  years (continues to smoke on a daily basis) ? ?ETOH abuse, if any: does not drink  ? ?Prior cancers, if any: skin cancer ? ?PREVIOUS RADIATION THERAPY: No ? ?PAST MEDICAL HISTORY:  has a past medical history of Anxiety,  Arthritis, COPD (chronic obstructive pulmonary disease) ([place]), Coronary artery disease, Depression, Dysphagia, Dyspnea, GERD (gastroesophageal reflux disease), History of hiatal hernia, Hyperlipidemia, Hypertension, Pneumonia, Shoulder fracture, left, Skin cancer, Wears dentures, and Wears glasses.   ? ?PAST SURGICAL HISTORY: ?Past Surgical History:  ?Procedure Laterality Date  ? ABDOMINAL HYSTERECTOMY    ? BALLOON DILATION N/A 12/16/2018  ? Procedure: BALLOON DILATION;  Surgeon: Elizabeth Essex, MD;  Location: [facility];  Service: Endoscopy;  Laterality: N/A;  ? COLONOSCOPY    ? ESOPHAGOGASTRODUODENOSCOPY (EGD) WITH PROPOFOL N/A 12/16/2018  ? Procedure: ESOPHAGOGASTRODUODENOSCOPY (EGD) WITH PROPOFOL, POSSIBLE BALLOON DILATION;  Surgeon: Elizabeth Essex, MD;  Location: [facility];  Service: Endoscopy;  Laterality: N/A;  ? HEMORRHOID SURGERY    ? MICROLARYNGOSCOPY N/A 06/04/2021  ? Procedure: MICROLARYNGOSCOPY;  Surgeon: Elizabeth Coop, DO;  Location: [facility] OR;  Service: ENT;  Laterality: N/A;  ? UPPER GI ENDOSCOPY    ? ? ?FAMILY HISTORY: family history includes Alcohol abuse in her father; Heart attack in her father; Heart disease in her father; Stomach cancer in her paternal grandmother; Stroke in her mother. ? ?SOCIAL HISTORY:  reports that she has been smoking cigarettes. She has a 130.00 pack-year smoking history. She has never used smokeless tobacco. She reports that she does not drink alcohol and does not use drugs. ? ?ALLERGIES: Azithromycin, Bee venom, Gabapentin, Lidocaine, Penicillins, and Serotonin reuptake inhibitors (ssris) ? ?MEDICATIONS:  ?Current Outpatient Medications  ?Medication Sig Dispense Refill  ? buPROPion (WELLBUTRIN SR) 150 MG 12 hr tablet Start one week before quit date. Take 1 tab daily x 3 days, then 1 tab BID thereafter. 60 tablet 2  ? nicotine (NICODERM CQ - DOSED IN MG/24 HOURS) 14 mg/24hr patch Place 1 patch (14 mg total) onto the skin daily. Apply 21 mg patch daily x 6 wk, then '14mg'$   patch daily x 2 wk, then 7 mg patch daily x 2 wk 14 patch 0  ? nicotine (NICODERM CQ - DOSED IN MG/24 HOURS) 21 mg/24hr patch Place 1 patch (21 mg total) onto the skin daily. Apply 21 mg patch daily x 6 wk, then '14mg'$  patch daily x 2 wk, then 7 mg patch daily x 2 wk 14 patch 2  ? nicotine (NICODERM CQ - DOSED IN MG/24 HR) 7 mg/24hr patch Place 1 patch (7 mg total) onto the skin daily. Apply 21 mg patch daily x 6 wk, then '14mg'$  patch daily x 2 wk, then 7 mg patch daily x 2 wk 14 patch 0  ? albuterol (PROVENTIL) (2.5 MG/3ML) 0.083% nebulizer solution Take 2.5 mg by nebulization every 6 (six) hours as needed for shortness of breath.     ? alendronate (FOSAMAX) 70 MG tablet Take 70 mg by mouth once a week. Take with a full glass of water on an empty stomach.    ? amLODipine (NORVASC) 5 MG tablet TAKE 1 TABLET(5 MG) BY MOUTH DAILY 90 tablet 1  ? aspirin EC 81 MG tablet Take 1 tablet (81 mg total) by mouth daily. 90 tablet 3  ? atorvastatin (LIPITOR) 20 MG tablet Take 20 mg by mouth daily.    ? baclofen (LIORESAL) 10 MG tablet Take 10 mg by mouth 3 (three) times daily as needed for muscle spasms.     ?  Cholecalciferol (VITAMIN D3) 125 MCG (5000 UT) TABS Take 5,000 Units by mouth daily.    ? docusate sodium (COLACE) 100 MG capsule Take 100 mg by mouth at bedtime as needed for mild constipation.    ? gabapentin (NEURONTIN) 300 MG capsule Take 300 mg by mouth 3 (three) times daily.    ? gabapentin (NEURONTIN) 400 MG capsule Take 400 mg by mouth 3 (three) times daily.    ? HYDROcodone-acetaminophen (NORCO) 10-325 MG tablet Take 1 tablet by mouth 4 (four) times daily as needed.    ? ibuprofen (ADVIL) 200 MG tablet Take 400 mg by mouth 2 (two) times daily.    ? nicotine (NICODERM CQ - DOSED IN MG/24 HOURS) 14 mg/24hr patch Place 1 patch (14 mg total) onto the skin daily. 28 patch 2  ? nicotine polacrilex (NICORETTE) 2 MG gum Take 1 each (2 mg total) by mouth as needed for smoking cessation. 100 tablet 2  ? omeprazole (PRILOSEC)  40 MG capsule 40 mg 2 (two) times daily.    ? OXYGEN Inhale 3.5 L into the lungs daily as needed (Low oxygen).    ? senna-docusate (SENOKOT-S) 8.6-50 MG tablet Take 2 tablets by mouth at bedtime as needed for

## 2021-06-24 NOTE — Progress Notes (Signed)
Oncology Nurse Navigator Documentation  ? ?Met with patient before her initial consult with Dr. Isidore Moos.  She was accompanied by her daughter.  ?Further introduced myself as her/their Navigator, explained my role as a member of the Care Team. ?Assisted with post-consult appt scheduling. ?They verbalized understanding of information provided. ?I encouraged them to call with questions/concerns moving forward. ? ?Harlow Asa, RN, BSN, OCN ?Head & Neck Oncology Nurse Navigator ?[place] [facility] ?[phone]  ?

## 2021-06-24 NOTE — Progress Notes (Signed)
Head and Neck Cancer Location of Tumor / Histology:  ?Squamous cell carcinoma of glottis ? ?Patient presented with symptoms of: referred to ENT Dr. Fredric Dine by Holland Commons, FNP, for evaluation and treatment of poor vocal quality. Patient states that she has not had a normal voice since sometime in the summer ? ?CT Neck w/ Contrast ?06/20/2021 ?--IMPRESSION: ?Asymmetric left glottic thickening correlating with history of carcinoma. Unfortunate motion at the level of the larynx with no convincing submucosal or sub site tumor. ?1 cm midline nodule most consistent with dermal inclusion cyst given location, please correlate with skin exam at this level. Negative for adenopathy. ?Advanced atherosclerosis with possible obstructive disease at the right carotid bifurcation and great vessel ostia. ?Aortic Atherosclerosis (ICD10-I70.0) and Emphysema (ICD10-J43.9). ? ?CT Chest w/o Contrast  ?05/14/2021 ?--IMPRESSION: ?New 6.5 mm left upper lobe pulmonary nodule with spiculated and irregular margins, highly suspicious for primary bronchogenic malignancy. This nodule is too small for PET characterization, however is suspicious by CT. Consider multi disciplinary referral for further workup options. ?All of the additional pulmonary nodules are unchanged from prior exam, largest measuring 4-5 mm, many of which are perifissural or subpleural in location. ?Moderate emphysema and bronchial thickening. ?Aortic atherosclerosis. Coronary artery calcifications or stents ? ?Biopsies revealed:  ?06/04/2021 ?FINAL MICROSCOPIC DIAGNOSIS:  ?A. VOCAL CORD LESIONS, RIGHT TRUE, BIOPSY:  ?- In situ squamous cell carcinoma, focally suspicious for invasion ?B. VOCAL CORD LESIONS, LEFT TRUE, BIOPSY:  ?- Invasive well to moderately differentiated squamous cell carcinoma ? ?Nutrition Status Yes No Comments  ?Weight changes? '[x]'$  '[]'$  Reports about 10 lb unintentional weight loss  ?Swallowing concerns? '[]'$  '[x]'$  Denies any difficulty, but does report  she has to be careful and take her time  ?PEG? '[]'$  '[x]'$    ? ?Referrals Yes No Comments  ?Social Work? '[x]'$  '[]'$    ?Dentistry? '[]'$  '[x]'$  Full set of dentures   ?Swallowing therapy? '[x]'$  '[]'$    ?Nutrition? '[x]'$  '[]'$    ?Med/Onc? '[]'$  '[x]'$    ? ?Safety Issues Yes No Comments  ?Prior radiation? '[]'$  '[x]'$    ?Pacemaker/ICD? '[]'$  '[x]'$    ?Possible current pregnancy? '[]'$  '[x]'$  Hysterectomy   ?Is the patient on methotrexate? '[]'$  '[x]'$    ? ?Tobacco/Marijuana/Snuff/ETOH use: Current every day smoker (smokes ~2 packs/day for the past 40 years). Denies any alcohol consumption or recreational drug use ? ?Past/Anticipated interventions by otolaryngology, if any:  ?06/04/2021 ?--Dr. Ebbie Latus  ?MICROLARYNGOSCOPY WITH BIOPSY ? ?Past/Anticipated interventions by medical oncology, if any:  ?No referral placed at this time ? ?Current Complaints / other details:  Had cardiac stress test yesterday  ? ? ? ? ?

## 2021-06-25 ENCOUNTER — Encounter: Payer: Self-pay | Admitting: Radiation Oncology

## 2021-06-25 ENCOUNTER — Other Ambulatory Visit: Payer: Self-pay

## 2021-06-25 DIAGNOSIS — R918 Other nonspecific abnormal finding of lung field: Secondary | ICD-10-CM

## 2021-06-26 ENCOUNTER — Ambulatory Visit (HOSPITAL_COMMUNITY): Payer: Medicare Other

## 2021-06-26 DIAGNOSIS — M791 Myalgia, unspecified site: Secondary | ICD-10-CM | POA: Diagnosis not present

## 2021-06-26 DIAGNOSIS — Z79891 Long term (current) use of opiate analgesic: Secondary | ICD-10-CM | POA: Diagnosis not present

## 2021-06-26 DIAGNOSIS — G894 Chronic pain syndrome: Secondary | ICD-10-CM | POA: Diagnosis not present

## 2021-06-27 ENCOUNTER — Other Ambulatory Visit: Payer: Self-pay

## 2021-06-27 ENCOUNTER — Telehealth: Payer: Self-pay | Admitting: *Deleted

## 2021-06-27 DIAGNOSIS — C320 Malignant neoplasm of glottis: Secondary | ICD-10-CM

## 2021-06-27 DIAGNOSIS — Z79899 Other long term (current) drug therapy: Secondary | ICD-10-CM | POA: Diagnosis not present

## 2021-06-27 DIAGNOSIS — Z510 Encounter for antineoplastic radiation therapy: Secondary | ICD-10-CM | POA: Diagnosis not present

## 2021-06-27 NOTE — Telephone Encounter (Signed)
CALLED PATIENT TO ASK ABOUT GETTING LABS, PATIENT AGREED TO HAVE THESE DONE ON 07-01-21 ?

## 2021-06-30 ENCOUNTER — Telehealth: Payer: Self-pay | Admitting: Nutrition

## 2021-06-30 ENCOUNTER — Ambulatory Visit
Admission: RE | Admit: 2021-06-30 | Discharge: 2021-06-30 | Disposition: A | Discharge status: Home / Self Care | Payer: Medicare Other | Source: Ambulatory Visit | Attending: Radiation Oncology | Admitting: Radiation Oncology

## 2021-06-30 ENCOUNTER — Other Ambulatory Visit: Payer: Self-pay | Admitting: Radiation Oncology

## 2021-06-30 ENCOUNTER — Other Ambulatory Visit: Payer: Self-pay

## 2021-06-30 ENCOUNTER — Encounter: Payer: Self-pay | Admitting: Licensed Clinical Social Worker

## 2021-06-30 DIAGNOSIS — C320 Malignant neoplasm of glottis: Secondary | ICD-10-CM

## 2021-06-30 DIAGNOSIS — Z510 Encounter for antineoplastic radiation therapy: Secondary | ICD-10-CM | POA: Diagnosis not present

## 2021-06-30 DIAGNOSIS — Z79899 Other long term (current) drug therapy: Secondary | ICD-10-CM | POA: Diagnosis not present

## 2021-06-30 MED ORDER — SUCRALFATE 1 G PO TABS: ORAL_TABLET | ORAL | 3 refills | Status: DC

## 2021-06-30 MED ORDER — SONAFINE EX EMUL
1.0000 "application " | Freq: Two times a day (BID) | CUTANEOUS | Status: DC
  Administered 2021-06-30: 1 via TOPICAL

## 2021-06-30 NOTE — Telephone Encounter (Signed)
Scheduled appointment per inbasket message. Patient aware.   ?

## 2021-06-30 NOTE — Progress Notes (Signed)
Pt here for patient teaching.   ? ?Pt given Radiation and You booklet, Managing Acute Radiation Side Effects for Head and Neck Cancer handout, skin care instructions, and Sonafine.   ? ?Reviewed areas of pertinence such as fatigue, hair loss, skin changes, throat changes, earaches, and taste changes .  ? ?Pt able to give teach back of to pat skin, use unscented/gentle soap, and drink plenty of water,apply Sonafine bid and avoid applying anything to skin within 4 hours of treatment.  ? ?Pt demonstrated understanding and verbalizes understanding of information given and will contact nursing with any questions or concerns.   ? ?Http://[email or web address]/treatmentinformation/whattoexpect/index ?  ? ? ? ? ? ? ?

## 2021-06-30 NOTE — Progress Notes (Signed)
[place] ?Clinical Social Work ? ?Clinical Social Work was referred by nurse navigator for assessment of psychosocial needs for new H&N pt.  Clinical Social Worker contacted patient by phone  to offer support and assess for needs.   ?Pt reports no resource or support needs at this time. Has strong support from her son and daughter in law. Pt has hoarse voice which makes it more difficult to have long conversations. CSW will plan to check-in with pt during [place] to provide support programming calendars. ? ? ? ? ? ?Elizabeth Velazquez E Elizabeth Townley, LCSW  ?Clinical Social Worker ?[place] ?      ? ?

## 2021-06-30 NOTE — Progress Notes (Signed)
Oncology Nurse Navigator Documentation  ? ?To provide support, encouragement and care continuity, met with Elizabeth Velazquez after her initial RT.  She was accompanied by her daughter-in-law. ?Elizabeth Velazquez completed treatment without difficulty, denied questions/concerns. ?I reviewed the registration/arrival procedure for subsequent treatments. ?I encouraged them to call me with questions/concerns as tmts proceed. ? ? ?Harlow Asa RN, BSN, OCN ?Head & Neck Oncology Nurse Navigator ?[place] [facility] ?Phone # [phone]  ?Fax # [phone]   ?

## 2021-07-01 ENCOUNTER — Ambulatory Visit
Admission: RE | Admit: 2021-07-01 | Discharge: 2021-07-01 | Disposition: A | Discharge status: Home / Self Care | Payer: Medicare Other | Source: Ambulatory Visit | Attending: Radiation Oncology | Admitting: Radiation Oncology

## 2021-07-01 DIAGNOSIS — C320 Malignant neoplasm of glottis: Secondary | ICD-10-CM

## 2021-07-01 DIAGNOSIS — Z510 Encounter for antineoplastic radiation therapy: Secondary | ICD-10-CM | POA: Diagnosis not present

## 2021-07-01 DIAGNOSIS — Z79899 Other long term (current) drug therapy: Secondary | ICD-10-CM | POA: Diagnosis not present

## 2021-07-01 LAB — TSH: TSH: 0.975 u[IU]/mL (ref 0.308–3.960)

## 2021-07-02 ENCOUNTER — Other Ambulatory Visit: Payer: Self-pay

## 2021-07-02 ENCOUNTER — Ambulatory Visit
Admission: RE | Admit: 2021-07-02 | Discharge: 2021-07-02 | Disposition: A | Discharge status: Home / Self Care | Payer: Medicare Other | Source: Ambulatory Visit | Attending: Radiation Oncology | Admitting: Radiation Oncology

## 2021-07-02 DIAGNOSIS — Z79899 Other long term (current) drug therapy: Secondary | ICD-10-CM | POA: Diagnosis not present

## 2021-07-02 DIAGNOSIS — C320 Malignant neoplasm of glottis: Secondary | ICD-10-CM | POA: Diagnosis not present

## 2021-07-02 DIAGNOSIS — Z510 Encounter for antineoplastic radiation therapy: Secondary | ICD-10-CM | POA: Diagnosis not present

## 2021-07-03 ENCOUNTER — Ambulatory Visit
Admission: RE | Admit: 2021-07-03 | Discharge: 2021-07-03 | Disposition: A | Discharge status: Home / Self Care | Payer: Medicare Other | Source: Ambulatory Visit | Attending: Radiation Oncology | Admitting: Radiation Oncology

## 2021-07-03 ENCOUNTER — Ambulatory Visit: Payer: Medicare Other | Admitting: Cardiology

## 2021-07-03 DIAGNOSIS — Z510 Encounter for antineoplastic radiation therapy: Secondary | ICD-10-CM | POA: Diagnosis not present

## 2021-07-03 DIAGNOSIS — Z79899 Other long term (current) drug therapy: Secondary | ICD-10-CM | POA: Diagnosis not present

## 2021-07-03 DIAGNOSIS — C320 Malignant neoplasm of glottis: Secondary | ICD-10-CM | POA: Diagnosis not present

## 2021-07-04 ENCOUNTER — Inpatient Hospital Stay: Payer: Medicare Other | Attending: Dietician | Admitting: Dietician

## 2021-07-04 ENCOUNTER — Ambulatory Visit
Admission: RE | Admit: 2021-07-04 | Discharge: 2021-07-04 | Disposition: A | Discharge status: Home / Self Care | Payer: Medicare Other | Source: Ambulatory Visit | Attending: Radiation Oncology | Admitting: Radiation Oncology

## 2021-07-04 ENCOUNTER — Other Ambulatory Visit: Payer: Self-pay

## 2021-07-04 DIAGNOSIS — Z79899 Other long term (current) drug therapy: Secondary | ICD-10-CM | POA: Diagnosis not present

## 2021-07-04 DIAGNOSIS — Z510 Encounter for antineoplastic radiation therapy: Secondary | ICD-10-CM | POA: Diagnosis not present

## 2021-07-04 DIAGNOSIS — C320 Malignant neoplasm of glottis: Secondary | ICD-10-CM | POA: Diagnosis not present

## 2021-07-04 NOTE — Progress Notes (Signed)
Nutrition Assessment ? ? ?Reason for Assessment: Head & Neck ? ? ?ASSESSMENT: 81 year old female with newly diagnosed larynx cancer. She is receiving radiotherapy (55 Gy/20 fx). Patient is under the care of Dr. Isidore Moos.  ? ?Past medical history includes COPD, anxiety, arthritis, hiatal hernia, HLD, sciatica  ? ?Met with patient and daughter in office after treatment. Patient is in a wheelchair. She reports chronic pain due to sciatica. Patient is hoarse. She denies sore throat or swallowing difficulty. Patient has a good appetite, recalls eating 4 small meals plus snacks. She has been eating this way for long time secondary to hernia. Patient is drinking 3-4 bottles of water, tea with lunch and supper and cup of coffee in the morning. Patient reports chronic constipation due to pain medications. She is taking stool softener and vegetable laxative daily. This works well for her.   ? ? ?Nutrition Focused Physical Exam:  ?Mild fat depletion - buccal ?Moderate fat depletion - orbital ?Mild muscle depletion - temple  ?Moderate muscle depletion - clavicle ? ? ?Medications: carafate, nicoderm, wellbutrin, gabapentin, norco, supplemental oxygen, colace, senokot, ambien ? ? ?Labs: 3/01 labs reviewed  ? ? ?Anthropometrics: Patient is ~10% under reported usual weight; significant ? ?Height: 4'9" ?Weight: 112.2 lb (3/27) ?UBW: 125-130 lb per pt  ?BMI: 24.3 ? ? ? ?NUTRITION DIAGNOSIS: Predicted suboptimal intake related to newly diagnosed larynx cancer and associated treatment as evidenced by radiotherapy side effects inhibiting ability to swallow.  ? ? ?INTERVENTION:  ?Educated on small frequent meals and snacks with adequate calories and protein  ?Discussed soft smooth foods and ways to alter textures to consistency that is easy to swallow - handout provided ?Discussed side effects of radiotherapy including altered taste, dry mouth/thick saliva, sore throat ?Recommend baking soda salt water rinses several times daily - handout  with recipe provided  ?Continue bedtime snack  ?Patient encouraged to start drinking one Boost supplement for added calories and protein  ?Contact information provided  ? ? ?MONITORING, EVALUATION, GOAL: Patient will tolerate increased calories and protein to minimize weight loss ? ? ?Next Visit: Monday April 10 after radiation with [facility] ? ? ? ? ? ? ?

## 2021-07-07 ENCOUNTER — Ambulatory Visit: Payer: Medicare Other | Admitting: Cardiology

## 2021-07-07 ENCOUNTER — Ambulatory Visit
Admission: RE | Admit: 2021-07-07 | Discharge: 2021-07-07 | Disposition: A | Discharge status: Home / Self Care | Payer: Medicare Other | Source: Ambulatory Visit | Attending: Radiation Oncology | Admitting: Radiation Oncology

## 2021-07-07 ENCOUNTER — Other Ambulatory Visit: Payer: Self-pay

## 2021-07-07 DIAGNOSIS — Z510 Encounter for antineoplastic radiation therapy: Secondary | ICD-10-CM | POA: Insufficient documentation

## 2021-07-07 DIAGNOSIS — Z79899 Other long term (current) drug therapy: Secondary | ICD-10-CM | POA: Insufficient documentation

## 2021-07-07 DIAGNOSIS — C320 Malignant neoplasm of glottis: Secondary | ICD-10-CM | POA: Insufficient documentation

## 2021-07-08 ENCOUNTER — Ambulatory Visit
Admission: RE | Admit: 2021-07-08 | Discharge: 2021-07-08 | Disposition: A | Discharge status: Home / Self Care | Payer: Medicare Other | Source: Ambulatory Visit | Attending: Radiation Oncology | Admitting: Radiation Oncology

## 2021-07-08 DIAGNOSIS — Z510 Encounter for antineoplastic radiation therapy: Secondary | ICD-10-CM | POA: Diagnosis not present

## 2021-07-08 DIAGNOSIS — Z79899 Other long term (current) drug therapy: Secondary | ICD-10-CM | POA: Diagnosis not present

## 2021-07-08 DIAGNOSIS — C320 Malignant neoplasm of glottis: Secondary | ICD-10-CM | POA: Diagnosis not present

## 2021-07-09 ENCOUNTER — Ambulatory Visit
Admission: RE | Admit: 2021-07-09 | Discharge: 2021-07-09 | Disposition: A | Discharge status: Home / Self Care | Payer: Medicare Other | Source: Ambulatory Visit | Attending: Radiation Oncology | Admitting: Radiation Oncology

## 2021-07-09 ENCOUNTER — Other Ambulatory Visit: Payer: Self-pay

## 2021-07-09 DIAGNOSIS — Z79899 Other long term (current) drug therapy: Secondary | ICD-10-CM | POA: Diagnosis not present

## 2021-07-09 DIAGNOSIS — Z510 Encounter for antineoplastic radiation therapy: Secondary | ICD-10-CM | POA: Diagnosis not present

## 2021-07-09 DIAGNOSIS — C320 Malignant neoplasm of glottis: Secondary | ICD-10-CM | POA: Diagnosis not present

## 2021-07-10 ENCOUNTER — Ambulatory Visit
Admission: RE | Admit: 2021-07-10 | Discharge: 2021-07-10 | Disposition: A | Discharge status: Home / Self Care | Payer: Medicare Other | Source: Ambulatory Visit | Attending: Radiation Oncology | Admitting: Radiation Oncology

## 2021-07-10 ENCOUNTER — Ambulatory Visit: Payer: Medicare Other | Attending: Radiation Oncology

## 2021-07-10 DIAGNOSIS — Z510 Encounter for antineoplastic radiation therapy: Secondary | ICD-10-CM | POA: Diagnosis not present

## 2021-07-10 DIAGNOSIS — C320 Malignant neoplasm of glottis: Secondary | ICD-10-CM | POA: Diagnosis not present

## 2021-07-10 DIAGNOSIS — R491 Aphonia: Secondary | ICD-10-CM | POA: Diagnosis not present

## 2021-07-10 DIAGNOSIS — R1310 Dysphagia, unspecified: Secondary | ICD-10-CM | POA: Diagnosis not present

## 2021-07-10 DIAGNOSIS — Z79899 Other long term (current) drug therapy: Secondary | ICD-10-CM | POA: Diagnosis not present

## 2021-07-10 NOTE — Progress Notes (Signed)
Oncology Nurse Navigator Documentation ? ?I met with Ms. Postlethwait briefly before her evaluation by SLP today. She is feeling weak but tolerating treatment well. She and her family know to call me if they have any questions or concerns. ? ?Harlow Asa RN, BSN, OCN ?Head & Neck Oncology Nurse Navigator ?[place] at [facility] ?Phone # [phone]  ?Fax # [phone]  ?

## 2021-07-10 NOTE — Therapy (Addendum)
[place] [facility] ?[address], [address] ?[place], [place], [address] ?Phone: [phone]   Fax:  [phone] ? ?Speech Language Pathology Evaluation ? ?Patient Details  ?Name: Elizabeth Velazquez ?MRN: [id number] ?Date of Birth: 03/01/1941 ?Referring Provider (SLP): Eppie Gibson, MD ? ? ?Encounter Date: 07/10/2021 ? ? End of Session - 07/10/21 1227   ? ? Visit Number 1   ? Number of Visits 3   ? Date for SLP Re-Evaluation 10/08/21   ? SLP Start Time 1133   ? SLP Stop Time  1205   ? SLP Time Calculation (min) 32 min   ? Activity Tolerance Patient tolerated treatment well   ? ?  ?  ? ?  ? ? ?Past Medical History:  ?Diagnosis Date  ? Anxiety   ? Arthritis   ? COPD (chronic obstructive pulmonary disease) ([place])   ? Coronary artery disease   ? Depression   ? Dysphagia   ? Dyspnea   ? GERD (gastroesophageal reflux disease)   ? History of hiatal hernia   ? Hyperlipidemia   ? Hypertension   ? Pneumonia   ? Shoulder fracture, left   ? Skin cancer   ? Wears dentures   ? Wears glasses   ? ? ?Past Surgical History:  ?Procedure Laterality Date  ? ABDOMINAL HYSTERECTOMY    ? BALLOON DILATION N/A 12/16/2018  ? Procedure: BALLOON DILATION;  Surgeon: Clarene Essex, MD;  Location: [facility];  Service: Endoscopy;  Laterality: N/A;  ? COLONOSCOPY    ? ESOPHAGOGASTRODUODENOSCOPY (EGD) WITH PROPOFOL N/A 12/16/2018  ? Procedure: ESOPHAGOGASTRODUODENOSCOPY (EGD) WITH PROPOFOL, POSSIBLE BALLOON DILATION;  Surgeon: Clarene Essex, MD;  Location: [facility];  Service: Endoscopy;  Laterality: N/A;  ? HEMORRHOID SURGERY    ? MICROLARYNGOSCOPY N/A 06/04/2021  ? Procedure: MICROLARYNGOSCOPY;  Surgeon: Jason Coop, DO;  Location: [facility];  Service: ENT;  Laterality: N/A;  ? UPPER GI ENDOSCOPY    ? ? ?There were no vitals filed for this visit. ? ? Subjective Assessment - 07/10/21 1144   ? ? Subjective Pt denies oral or pharyngeal deficits with swallowing at this time. Eating regular diet items presently.   ? Patient is  accompained by: [name] - caregiver  ? Currently in Pain? Yes   ? Pain Score 4    ? Pain Location Throat   ? Pain Orientation Right;Left   ? Pain Descriptors / Indicators Sore   ? Pain Type Acute pain   ? ?  ?  ? ?  ? ? ? ? ? SLP Evaluation [facility] - 07/10/21 1144   ? ?  ? SLP Visit Information  ? SLP Received On 07/10/21   ? Referring Provider (SLP) Eppie Gibson, MD   ? Onset Date Jan-Feb 2023   ? Medical Diagnosis Glottic CA   ?  ? Subjective  ? Subjective Becaues of hiatal hernia pt has 4 smaller meals per day rather than 3 normal size meals   ? Patient/Family Stated Goal Maintain WNL swallowing   ?  ? General Information  ? HPI Pt with glottis carcinoma, stage I (T1b, N0, M0) ?She presented to her PCP (unknown date) with voice changes. 05/12/21 she saw Dr. Fredric Dine who performed a laryngoscopy which revealed an exophytic lesion about the bilateral true vocal folds with involvement of anterior commissure concerning for malignant neoplasm. No palpable adenopathy was appreciated on examination. Accordingly, Dr. Fredric Dine recommended further work-up consisting of microlaryngoscopy and biopsies of the lesion.  06/04/21 Biopsy of her left true vocal cord revealed well to moderately differentiated SCC. 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  Consult with Dr. Isidore Moos on 06/24/21 and radiation was recommended.  She will receive 20 fractions of radiation to her Glottis only.  She started on 06/30/21 and will complete 07/25/21.   ?  ? Balance Screen  ? Has the patient fallen in the past 6 months No   ?  ? Prior Functional Status  ? Cognitive/Linguistic Baseline Within functional limits   ?  ? Cognition  ? Overall Cognitive Status Within Functional Limits for tasks assessed   ?  ? Auditory Comprehension  ? Overall Auditory Comprehension Appears within functional limits for tasks assessed   ?  ? Oral Motor/Sensory Function  ? Overall Oral Motor/Sensory Function Impaired   mainly  due to oral tremor in tongue, lips  ? Labial Coordination Reduced   ? Lingual Coordination Reduced   ?  ? Motor Speech  ? Phonation Other (comment)   vocal tremor noted - demonstrated today by variable intensity of staccato breathing (diaphragmatic tremor??) during aphonic speech  ? ?  ?  ? ?  ? ?Pt currently tolerates regular diet/thin liquids. POs: Pt ate applesauce (with a choice of applesauce or [place] sandwich, given pt only wearing top dentures to eval today) without overt s/s oral or pharyngeal difficulty. Thyroid elevation appeared adequate, and swallows appeared timely. Pt's swallow deemed WFL/WNL at this time.  ? ?Because data states the risk for dysphagia during and after radiation treatment is high due to undergoing radiation tx, SLP taught pt about the possibility of reduced/limited ability for PO intake during rad tx. Among modifications for days when pt cannot functionally swallow, SLP talked about performing only non-swallowing tasks on the handout/HEP, and if necessary to cycle through the swallowing portion so the program of exercises can be completed instead of fatiguing on one of the swallowing exercises and not being able to perform the other swallowing exercises.  ? ?SLP educated pt re: changes to swallowing musculature after rad tx, and why adherence to dysphagia HEP provided today and PO consumption was necessary to inhibit muscle fibrosis following rad tx. Pt demonstrated understanding of these things to SLP.  ?  ?SLP then developed a HEP for pt and pt was instructed how to perform exercises involving lingual, vocal, and pharyngeal strengthening and ROM. SLP performed each exercise and pt return demonstrated each exercise. SLP ensured pt performance was correct prior to moving on to next exercise. Pt was instructed to complete this program -7 days/week, 2 times a day. Documentation was provided showing pt should complete this as directed until 6 months after her last rad tx, then x2 a week  after that. ? ? ? ? ? ? ? ? ? ? ? ? ? ? ? ? ? SLP Education - 07/10/21 1227   ? ? Education Details late effects head/neck radiation on swallowing, HEP procedure, compensations for HEP completion with pain   ? Person(s) Educated Associate Professor)   ? Methods Explanation;Demonstration;Verbal cues;Handout   ? Comprehension Verbalized understanding;Returned demonstration;Verbal cues required   ? ?  ?  ? ?  ? ? ?SLP Short Term Goals - 07/10/21  ?       ?SLP SHORT TERM GOAL #1    ?  Title ?Time pt will complete HEP with rare min A  ? 1  ?  Period --   sessions, for all STGs  ?  Status New  ?       ?  SLP SHORT TERM GOAL #2  ?  Title ?  ?Time ?Period pt will tell SLP why pt is completing HEP with modified independence x2 visits ?2     ?  Status New  ?       ?  SLP SHORT TERM GOAL #3  ?  Title ?  ?Time ?Period pt will describe 3 overt s/s aspiration PNA with modified independence  ?2  ?  Status New  ?       ?  SLP SHORT TERM GOAL #4  ?  Title ?  ?Time ?Period pt will tell SLP how a food journal could hasten return to a more normalized diet  ?2  ?  Status New  ?  ?   ?  ?  ?   ?  ?  ?  SLP Long Term Goals - 07/10/21    ?  ?       ?       ?  SLP LONG TERM GOAL #1  ?  Title pt will complete HEP with modified independence over three visits   ?  Time 4  ?  Period --   sessions, for all LTGs  ?  Status New  ?       ?  SLP LONG TERM GOAL #2  ?  Title pt will describe how to modify HEP over time, and the timeline associated with reduction in HEP frequency with modified independence over two sessions   ?  Time ?Period 6 ?   ?  Status New  ?  ?   ?  ?  ?   ?  ?  ?  Plan - 07/10/21  ?  ?  Clinical Impression Statement As above, pt swallowing is deemed WFL/WNL with items assessed today; pt endorses eating regular diet items without any overt s/sx or oral or pharyngeal difficulties reported - caregiver agrees. SLP developed an individualized HEP for dysphagia and pt completed each exercise on his own initially with rare min assist  from SLP faded to pt independence. There are no overt s/s aspiration with POs reported by pt at this time, nor overt s/sx aspiration PNA; Neither oral nor pharyngeal difficulties were detected today whe

## 2021-07-10 NOTE — Patient Instructions (Signed)
SWALLOWING EXERCISES ?Do these until 6 months after your last day of radiation, then 2-3 times per week afterwards ? ?Effortful Swallows ?- Press your tongue against the roof of your mouth for 3 seconds, then squeeze the muscles in your neck while you swallow your saliva or a sip of water ?- Repeat 10-15 times, 2-3 times a day, and use whenever you eat or drink ? ?Pitch Raise ?- Repeat ?he?, once per second in as high of a pitch as you can ?- Repeat 20 times, 2-3 times a day ? ?     3.  "Super Swallow" ? - Take a breath and hold it ? - Swallow then IMMEDIATELY cough ? - Repeat 10 times, 2-3 times a day ? ?

## 2021-07-11 ENCOUNTER — Other Ambulatory Visit: Payer: Self-pay

## 2021-07-11 ENCOUNTER — Ambulatory Visit
Admission: RE | Admit: 2021-07-11 | Discharge: 2021-07-11 | Disposition: A | Discharge status: Home / Self Care | Payer: Medicare Other | Source: Ambulatory Visit | Attending: Radiation Oncology | Admitting: Radiation Oncology

## 2021-07-11 DIAGNOSIS — Z79899 Other long term (current) drug therapy: Secondary | ICD-10-CM | POA: Diagnosis not present

## 2021-07-11 DIAGNOSIS — Z510 Encounter for antineoplastic radiation therapy: Secondary | ICD-10-CM | POA: Diagnosis not present

## 2021-07-11 DIAGNOSIS — C320 Malignant neoplasm of glottis: Secondary | ICD-10-CM | POA: Diagnosis not present

## 2021-07-14 ENCOUNTER — Other Ambulatory Visit: Payer: Self-pay | Admitting: Radiation Oncology

## 2021-07-14 ENCOUNTER — Ambulatory Visit
Admission: RE | Admit: 2021-07-14 | Discharge: 2021-07-14 | Disposition: A | Discharge status: Home / Self Care | Payer: Medicare Other | Source: Ambulatory Visit | Attending: Radiation Oncology | Admitting: Radiation Oncology

## 2021-07-14 ENCOUNTER — Inpatient Hospital Stay: Payer: Medicare Other | Attending: Radiation Oncology

## 2021-07-14 ENCOUNTER — Other Ambulatory Visit: Payer: Self-pay

## 2021-07-14 DIAGNOSIS — Z79899 Other long term (current) drug therapy: Secondary | ICD-10-CM | POA: Diagnosis not present

## 2021-07-14 DIAGNOSIS — C320 Malignant neoplasm of glottis: Secondary | ICD-10-CM | POA: Diagnosis not present

## 2021-07-14 DIAGNOSIS — Z510 Encounter for antineoplastic radiation therapy: Secondary | ICD-10-CM | POA: Diagnosis not present

## 2021-07-14 MED ORDER — LIDOCAINE VISCOUS HCL 2 % MT SOLN: OROMUCOSAL | 3 refills | Status: DC

## 2021-07-14 NOTE — Progress Notes (Signed)
Nutrition Follow-up: ? ?Patient with larynx cancer.  Patient is receiving radiation alone.   ? ?Met with patient and female friend/family (?).  Patient not feeling well and requesting visit to be short, ready to go home.  Reports that over the last 2 days it has been more painful to swallow.  Ate 2 eggs this am before coming to treatment.  Yesterday was able to eat cake, pie, chicken, macaroni and cheese, green beans, mashed potatoes.  Has not started drinking oral nutrition supplements yet.   ? ? ? ?Medications: lidocaine ordered today ? ?Labs: no new ? ?Anthropometrics:  ? ?Weight 110 lb today per [facility] ? ?112 lb 2 oz on 3/27 per [facility] ?UBW of 125-130 lb ? ? ?NUTRITION DIAGNOSIS: Predicted suboptimal intake continues ? ? ? ?INTERVENTION:  ?Reviewed soft, moist foods. Handout provided ?Recommend patient start 350 calorie shake today.  Wrote examples of 350 calorie shake down for patient to try.  ? ?  ? ?MONITORING, EVALUATION, GOAL: weight trends, intake ? ? ?NEXT VISIT: Tuesday, April 18th, prefers not to meet on Mondays after radiation.  ? ?Golden Gilreath B. Zenia Resides, RD, LDN ?Registered Dietitian ?[phone] ? ? ?

## 2021-07-15 ENCOUNTER — Ambulatory Visit
Admission: RE | Admit: 2021-07-15 | Discharge: 2021-07-15 | Disposition: A | Discharge status: Home / Self Care | Payer: Medicare Other | Source: Ambulatory Visit | Attending: Radiation Oncology | Admitting: Radiation Oncology

## 2021-07-15 DIAGNOSIS — C320 Malignant neoplasm of glottis: Secondary | ICD-10-CM | POA: Diagnosis not present

## 2021-07-15 DIAGNOSIS — Z79899 Other long term (current) drug therapy: Secondary | ICD-10-CM | POA: Diagnosis not present

## 2021-07-15 DIAGNOSIS — Z510 Encounter for antineoplastic radiation therapy: Secondary | ICD-10-CM | POA: Diagnosis not present

## 2021-07-16 ENCOUNTER — Ambulatory Visit
Admission: RE | Admit: 2021-07-16 | Discharge: 2021-07-16 | Disposition: A | Discharge status: Home / Self Care | Payer: Medicare Other | Source: Ambulatory Visit | Attending: Radiation Oncology | Admitting: Radiation Oncology

## 2021-07-16 ENCOUNTER — Other Ambulatory Visit: Payer: Self-pay

## 2021-07-16 DIAGNOSIS — Z79899 Other long term (current) drug therapy: Secondary | ICD-10-CM | POA: Diagnosis not present

## 2021-07-16 DIAGNOSIS — Z510 Encounter for antineoplastic radiation therapy: Secondary | ICD-10-CM | POA: Diagnosis not present

## 2021-07-16 DIAGNOSIS — C320 Malignant neoplasm of glottis: Secondary | ICD-10-CM | POA: Diagnosis not present

## 2021-07-17 ENCOUNTER — Ambulatory Visit
Admission: RE | Admit: 2021-07-17 | Discharge: 2021-07-17 | Disposition: A | Discharge status: Home / Self Care | Payer: Medicare Other | Source: Ambulatory Visit | Attending: Radiation Oncology | Admitting: Radiation Oncology

## 2021-07-17 DIAGNOSIS — C320 Malignant neoplasm of glottis: Secondary | ICD-10-CM | POA: Diagnosis not present

## 2021-07-17 DIAGNOSIS — Z510 Encounter for antineoplastic radiation therapy: Secondary | ICD-10-CM | POA: Diagnosis not present

## 2021-07-17 DIAGNOSIS — Z79899 Other long term (current) drug therapy: Secondary | ICD-10-CM | POA: Diagnosis not present

## 2021-07-18 ENCOUNTER — Ambulatory Visit
Admission: RE | Admit: 2021-07-18 | Discharge: 2021-07-18 | Disposition: A | Discharge status: Home / Self Care | Payer: Medicare Other | Source: Ambulatory Visit | Attending: Radiation Oncology | Admitting: Radiation Oncology

## 2021-07-18 ENCOUNTER — Other Ambulatory Visit: Payer: Self-pay

## 2021-07-18 DIAGNOSIS — Z79899 Other long term (current) drug therapy: Secondary | ICD-10-CM | POA: Diagnosis not present

## 2021-07-18 DIAGNOSIS — C320 Malignant neoplasm of glottis: Secondary | ICD-10-CM | POA: Diagnosis not present

## 2021-07-18 DIAGNOSIS — Z510 Encounter for antineoplastic radiation therapy: Secondary | ICD-10-CM | POA: Diagnosis not present

## 2021-07-21 ENCOUNTER — Ambulatory Visit
Admission: RE | Admit: 2021-07-21 | Discharge: 2021-07-21 | Disposition: A | Discharge status: Home / Self Care | Payer: Medicare Other | Source: Ambulatory Visit | Attending: Radiation Oncology | Admitting: Radiation Oncology

## 2021-07-21 DIAGNOSIS — C320 Malignant neoplasm of glottis: Secondary | ICD-10-CM | POA: Diagnosis not present

## 2021-07-21 DIAGNOSIS — Z79899 Other long term (current) drug therapy: Secondary | ICD-10-CM | POA: Diagnosis not present

## 2021-07-21 DIAGNOSIS — Z510 Encounter for antineoplastic radiation therapy: Secondary | ICD-10-CM | POA: Diagnosis not present

## 2021-07-22 ENCOUNTER — Ambulatory Visit
Admission: RE | Admit: 2021-07-22 | Discharge: 2021-07-22 | Disposition: A | Discharge status: Home / Self Care | Payer: Medicare Other | Source: Ambulatory Visit | Attending: Radiation Oncology | Admitting: Radiation Oncology

## 2021-07-22 ENCOUNTER — Other Ambulatory Visit: Payer: Self-pay

## 2021-07-22 ENCOUNTER — Inpatient Hospital Stay: Payer: Medicare Other | Admitting: Dietician

## 2021-07-22 DIAGNOSIS — G894 Chronic pain syndrome: Secondary | ICD-10-CM | POA: Diagnosis not present

## 2021-07-22 DIAGNOSIS — M791 Myalgia, unspecified site: Secondary | ICD-10-CM | POA: Diagnosis not present

## 2021-07-22 DIAGNOSIS — C320 Malignant neoplasm of glottis: Secondary | ICD-10-CM | POA: Diagnosis not present

## 2021-07-22 DIAGNOSIS — Z510 Encounter for antineoplastic radiation therapy: Secondary | ICD-10-CM | POA: Diagnosis not present

## 2021-07-22 DIAGNOSIS — Z79891 Long term (current) use of opiate analgesic: Secondary | ICD-10-CM | POA: Diagnosis not present

## 2021-07-22 DIAGNOSIS — Z79899 Other long term (current) drug therapy: Secondary | ICD-10-CM | POA: Diagnosis not present

## 2021-07-22 DIAGNOSIS — M542 Cervicalgia: Secondary | ICD-10-CM | POA: Diagnosis not present

## 2021-07-22 LAB — RAD ONC ARIA SESSION SUMMARY
Course Elapsed Days: 22
Plan Fractions Treated to Date: 17
Plan Prescribed Dose Per Fraction: 2.75 Gy
Plan Total Fractions Prescribed: 20
Plan Total Prescribed Dose: 55 Gy
Reference Point Dosage Given to Date: 46.75 Gy
Reference Point Session Dosage Given: 2.75 Gy
Session Number: 17

## 2021-07-22 NOTE — Progress Notes (Signed)
Nutrition Follow-up: ? ?Patient with larynx cancer. She is receiving radiation therapy. Final treatment planned 4/21.  ? ?Met with patient and granddaughter in office. Patient reports having too many appointments today and feels rushed. She is concerned about not making the pain clinic appointment on time as they will charge her. Patient endorses sore throat and pain with swallowing. She is forcing herself to eat. Patient reports using lidocaine rinse, but this does not seem to help much. Patient denies altered taste. Yesterday, she ate a burger and bacon egg with cheese sandwich. She also ate this for breakfast this morning. Patient is drinking one Ensure most days. She denies nausea, vomiting, diarrhea, constipation. Patient takes stool softener and laxative daily.  ? ?Medications: reviewed ? ?Labs: no new labs ? ?Anthropometrics: Last weight 108.4 lb on 4/17 decreased ? ?4/10 - 110 lb ?3/27 - 112.2 lb  ? ?3.6% (4 lbs) in the last 3 weeks; concerning ? ? ?NUTRITION DIAGNOSIS: Predicted suboptimal intake continues  ? ? ?INTERVENTION:  ?Encouraged soft moist high calorie high protein foods ?Reinforced importance of increased needs to support healing ?Continue drinking Ensure Plus/equivalent, recommend 2/day ?Patient has contact information  ?  ? ?MONITORING, EVALUATION, GOAL: weight trends, intake  ? ? ?NEXT VISIT: Wednesday May 24  ? ? ? ?

## 2021-07-23 ENCOUNTER — Other Ambulatory Visit: Payer: Self-pay

## 2021-07-23 ENCOUNTER — Ambulatory Visit
Admission: RE | Admit: 2021-07-23 | Discharge: 2021-07-23 | Disposition: A | Discharge status: Home / Self Care | Payer: Medicare Other | Source: Ambulatory Visit | Attending: Radiation Oncology | Admitting: Radiation Oncology

## 2021-07-23 DIAGNOSIS — Z79899 Other long term (current) drug therapy: Secondary | ICD-10-CM | POA: Diagnosis not present

## 2021-07-23 DIAGNOSIS — C320 Malignant neoplasm of glottis: Secondary | ICD-10-CM | POA: Diagnosis not present

## 2021-07-23 DIAGNOSIS — Z510 Encounter for antineoplastic radiation therapy: Secondary | ICD-10-CM | POA: Diagnosis not present

## 2021-07-23 LAB — RAD ONC ARIA SESSION SUMMARY
Course Elapsed Days: 23
Plan Fractions Treated to Date: 18
Plan Prescribed Dose Per Fraction: 2.75 Gy
Plan Total Fractions Prescribed: 20
Plan Total Prescribed Dose: 55 Gy
Reference Point Dosage Given to Date: 49.5 Gy
Reference Point Session Dosage Given: 2.75 Gy
Session Number: 18

## 2021-07-24 ENCOUNTER — Other Ambulatory Visit: Payer: Self-pay

## 2021-07-24 ENCOUNTER — Ambulatory Visit
Admission: RE | Admit: 2021-07-24 | Discharge: 2021-07-24 | Disposition: A | Discharge status: Home / Self Care | Payer: Medicare Other | Source: Ambulatory Visit | Attending: Radiation Oncology | Admitting: Radiation Oncology

## 2021-07-24 DIAGNOSIS — Z510 Encounter for antineoplastic radiation therapy: Secondary | ICD-10-CM | POA: Diagnosis not present

## 2021-07-24 DIAGNOSIS — C320 Malignant neoplasm of glottis: Secondary | ICD-10-CM | POA: Diagnosis not present

## 2021-07-24 DIAGNOSIS — Z79899 Other long term (current) drug therapy: Secondary | ICD-10-CM | POA: Diagnosis not present

## 2021-07-24 LAB — RAD ONC ARIA SESSION SUMMARY
Course Elapsed Days: 24
Plan Fractions Treated to Date: 19
Plan Prescribed Dose Per Fraction: 2.75 Gy
Plan Total Fractions Prescribed: 20
Plan Total Prescribed Dose: 55 Gy
Reference Point Dosage Given to Date: 52.25 Gy
Reference Point Session Dosage Given: 2.75 Gy
Session Number: 19

## 2021-07-25 ENCOUNTER — Other Ambulatory Visit: Payer: Self-pay

## 2021-07-25 ENCOUNTER — Ambulatory Visit
Admission: RE | Admit: 2021-07-25 | Discharge: 2021-07-25 | Disposition: A | Discharge status: Home / Self Care | Payer: Medicare Other | Source: Ambulatory Visit | Attending: Radiation Oncology | Admitting: Radiation Oncology

## 2021-07-25 ENCOUNTER — Encounter: Payer: Self-pay | Admitting: Radiation Oncology

## 2021-07-25 DIAGNOSIS — C320 Malignant neoplasm of glottis: Secondary | ICD-10-CM | POA: Diagnosis not present

## 2021-07-25 DIAGNOSIS — Z510 Encounter for antineoplastic radiation therapy: Secondary | ICD-10-CM | POA: Diagnosis not present

## 2021-07-25 DIAGNOSIS — Z79899 Other long term (current) drug therapy: Secondary | ICD-10-CM | POA: Diagnosis not present

## 2021-07-25 LAB — RAD ONC ARIA SESSION SUMMARY
Course Elapsed Days: 25
Plan Fractions Treated to Date: 20
Plan Prescribed Dose Per Fraction: 2.75 Gy
Plan Total Fractions Prescribed: 20
Plan Total Prescribed Dose: 55 Gy
Reference Point Dosage Given to Date: 55 Gy
Reference Point Session Dosage Given: 2.75 Gy
Session Number: 20

## 2021-07-25 NOTE — Progress Notes (Signed)
Oncology Nurse Navigator Documentation  ? ?Met with [name] after final RT to offer support and to celebrate end of radiation treatment.   ?Provided verbal/written post-RT guidance: ?Importance of keeping all follow-up appts, especially those with Nutrition and SLP. ?Importance of protecting treatment area from sun. ?Continuation of Sonafine application 2-3 times daily, application of antibiotic ointment to areas of raw skin; when supply of Sonafine exhausted transition to OTC lotion with vitamin E. ?Provided/reviewed Epic calendar of upcoming appts. ?Explained my role as navigator will continue for several more months, encouraged him to call me with needs/concerns.   ? ?Harlow Asa RN, BSN, OCN ?Head & Neck Oncology Nurse Navigator ?[place] at [facility] ?Phone # [phone]  ?Fax # [phone]   ?

## 2021-07-28 ENCOUNTER — Ambulatory Visit: Payer: Medicare Other | Admitting: Cardiology

## 2021-08-07 ENCOUNTER — Other Ambulatory Visit: Payer: Self-pay

## 2021-08-07 DIAGNOSIS — C320 Malignant neoplasm of glottis: Secondary | ICD-10-CM

## 2021-08-08 ENCOUNTER — Ambulatory Visit: Payer: Medicare Other | Admitting: Radiation Oncology

## 2021-08-08 ENCOUNTER — Ambulatory Visit: Payer: Medicare Other

## 2021-08-12 ENCOUNTER — Ambulatory Visit: Payer: Medicare Other

## 2021-08-25 DIAGNOSIS — M542 Cervicalgia: Secondary | ICD-10-CM | POA: Diagnosis not present

## 2021-08-25 DIAGNOSIS — K5903 Drug induced constipation: Secondary | ICD-10-CM | POA: Diagnosis not present

## 2021-08-25 DIAGNOSIS — M791 Myalgia, unspecified site: Secondary | ICD-10-CM | POA: Diagnosis not present

## 2021-08-25 DIAGNOSIS — M5442 Lumbago with sciatica, left side: Secondary | ICD-10-CM | POA: Diagnosis not present

## 2021-08-27 ENCOUNTER — Ambulatory Visit
Admission: RE | Admit: 2021-08-27 | Discharge: 2021-08-27 | Disposition: A | Discharge status: Home / Self Care | Payer: Medicare Other | Source: Ambulatory Visit | Attending: Radiation Oncology | Admitting: Radiation Oncology

## 2021-08-27 ENCOUNTER — Encounter: Payer: Self-pay | Admitting: Radiation Oncology

## 2021-08-27 ENCOUNTER — Inpatient Hospital Stay: Payer: Medicare Other | Attending: Radiation Oncology | Admitting: Dietician

## 2021-08-27 DIAGNOSIS — Z7951 Long term (current) use of inhaled steroids: Secondary | ICD-10-CM | POA: Insufficient documentation

## 2021-08-27 DIAGNOSIS — Z79899 Other long term (current) drug therapy: Secondary | ICD-10-CM | POA: Diagnosis not present

## 2021-08-27 DIAGNOSIS — C320 Malignant neoplasm of glottis: Secondary | ICD-10-CM | POA: Insufficient documentation

## 2021-08-27 DIAGNOSIS — Z7982 Long term (current) use of aspirin: Secondary | ICD-10-CM | POA: Diagnosis not present

## 2021-08-27 NOTE — Progress Notes (Signed)
Radiation Oncology         [phone] ________________________________  Name: Elizabeth Velazquez MRN: [id number]  Date: 08/27/2021  DOB: 1941-03-10  Follow-Up Visit Note  CC: Holland Commons, FNP  Skotnicki, Meghan A, DO  Diagnosis and Prior Radiotherapy:       ICD-10-CM   1. Glottis carcinoma ([place])  C32.0       Cancer Staging  Glottis carcinoma [facility]) Staging form: Larynx - Glottis, AJCC 8th Edition - Clinical stage from 06/18/2021: Stage I (cT1b, cN0, cM0) - Signed by Eppie Gibson, MD on 06/18/2021 Stage prefix: Initial diagnosis   CHIEF COMPLAINT:  Here for follow-up and surveillance of glottic cancer  Narrative:  The patient returns today for routine follow-up.  Elizabeth Velazquez presents today for follow-up after completing radiation to her larynx on 07/25/2021  Pain issues, if any: Yes,patient states she has a sore throat. Rating 8/10. Patient reports taking oxycodone/apap  7.5 mg-'325mg'$ which is effective.   Voice is better.   Using a feeding tube?: N/A Weight changes, if any:  Wt Readings from Last 3 Encounters:  08/27/21 101 lb 4 oz (45.9 kg)  06/24/21 111 lb (50.3 kg)  06/04/21 107 lb (48.5 kg)   Patient reports appetite is improving.   Swallowing issues, if any: Yes, continues to use lidocaine.  Smoking or chewing tobacco? Yes patient continues to smoke.  Using fluoride trays daily? NA Last ENT visit was on: Not since diagnosis Other notable issues, if any: Patient reports throat continues to swell. Patient states she thinks the swelling is related to her smoking.   BP 119/66 (BP Location: Left Arm, Patient Position: Sitting)   Pulse 96   Temp 97.6 F (36.4 C) (Temporal)   Resp 20   Ht '4\' 9"'$  (1.448 m)   Wt 101 lb 4 oz (45.9 kg)   SpO2 100%   BMI 21.91 kg/m                      ALLERGIES:  is allergic to azithromycin, bee venom, gabapentin, lidocaine, penicillins, and serotonin reuptake inhibitors (ssris).  Meds: Current Outpatient Medications   Medication Sig Dispense Refill   oxyCODONE-acetaminophen (PERCOCET) 7.5-325 MG tablet Take 1 tablet by mouth every 6 (six) hours as needed for severe pain.     OXYGEN Inhale 3.5 L into the lungs daily as needed (Low oxygen).     senna-docusate (SENOKOT-S) 8.6-50 MG tablet Take 2 tablets by mouth at bedtime as needed for mild constipation.     albuterol (PROVENTIL) (2.5 MG/3ML) 0.083% nebulizer solution Take 2.5 mg by nebulization every 6 (six) hours as needed for shortness of breath.      alendronate (FOSAMAX) 70 MG tablet Take 70 mg by mouth once a week. Take with a full glass of water on an empty stomach.     amLODipine (NORVASC) 5 MG tablet TAKE 1 TABLET(5 MG) BY MOUTH DAILY 90 tablet 1   aspirin EC 81 MG tablet Take 1 tablet (81 mg total) by mouth daily. 90 tablet 3   atorvastatin (LIPITOR) 20 MG tablet Take 20 mg by mouth daily.     baclofen (LIORESAL) 10 MG tablet Take 10 mg by mouth 3 (three) times daily as needed for muscle spasms.      buPROPion (WELLBUTRIN SR) 150 MG 12 hr tablet Start one week before quit date. Take 1 tab daily x 3 days, then 1 tab BID thereafter. 60 tablet 2   Cholecalciferol (VITAMIN D3) 125 MCG (5000  UT) TABS Take 5,000 Units by mouth daily.     docusate sodium (COLACE) 100 MG capsule Take 100 mg by mouth at bedtime as needed for mild constipation.     gabapentin (NEURONTIN) 300 MG capsule Take 300 mg by mouth 3 (three) times daily.     gabapentin (NEURONTIN) 400 MG capsule Take 400 mg by mouth 3 (three) times daily.     HYDROcodone-acetaminophen (NORCO) 10-325 MG tablet Take 1 tablet by mouth 4 (four) times daily as needed.     ibuprofen (ADVIL) 200 MG tablet Take 400 mg by mouth 2 (two) times daily.     lidocaine (XYLOCAINE) 2 % solution Patient: Mix 1part 2% viscous lidocaine, 1part H20. Swallow 62m of diluted mixture, 341m before meals and at bedtime, up to QID. (Try 83m76mor first dose due to unclear possible allergy; have family present). 200 mL 3   nicotine  (NICODERM CQ - DOSED IN MG/24 HOURS) 14 mg/24hr patch Place 1 patch (14 mg total) onto the skin daily. (Patient not taking: Reported on 08/27/2021) 28 patch 2   nicotine (NICODERM CQ - DOSED IN MG/24 HOURS) 14 mg/24hr patch Place 1 patch (14 mg total) onto the skin daily. Apply 21 mg patch daily x 6 wk, then '14mg'$  patch daily x 2 wk, then 7 mg patch daily x 2 wk (Patient not taking: Reported on 08/27/2021) 14 patch 0   nicotine (NICODERM CQ - DOSED IN MG/24 HOURS) 21 mg/24hr patch Place 1 patch (21 mg total) onto the skin daily. Apply 21 mg patch daily x 6 wk, then '14mg'$  patch daily x 2 wk, then 7 mg patch daily x 2 wk (Patient not taking: Reported on 08/27/2021) 14 patch 2   nicotine (NICODERM CQ - DOSED IN MG/24 HR) 7 mg/24hr patch Place 1 patch (7 mg total) onto the skin daily. Apply 21 mg patch daily x 6 wk, then '14mg'$  patch daily x 2 wk, then 7 mg patch daily x 2 wk (Patient not taking: Reported on 08/27/2021) 14 patch 0   nicotine polacrilex (NICORETTE) 2 MG gum Take 1 each (2 mg total) by mouth as needed for smoking cessation. (Patient not taking: Reported on 08/27/2021) 100 tablet 2   omeprazole (PRILOSEC) 40 MG capsule 40 mg 2 (two) times daily.     sucralfate (CARAFATE) 1 g tablet Dissolve 1 tablet in 10 mL H20 and swallow 30 min prior to meals and bedtime, PRN sore throat 40 tablet 3   SYMBICORT 160-4.5 MCG/ACT inhaler Inhale 2 puffs into the lungs daily as needed (Breathing/COPD/emphysema).     Tiotropium Bromide Monohydrate (SPIRIVA RESPIMAT) 2.5 MCG/ACT AERS Inhale 2 puffs into the lungs daily. (Patient taking differently: Inhale 1 puff into the lungs daily.) 8 g 0   TURMERIC PO Take 1 tablet by mouth daily as needed (If feel like getting sick).     venlafaxine XR (EFFEXOR-XR) 150 MG 24 hr capsule Take 150 mg by mouth daily with breakfast.     zolpidem (AMBIEN) 5 MG tablet Take 5 mg by mouth at bedtime.     No current facility-administered medications for this encounter.    Physical  Findings: The patient is in no acute distress. Patient is alert and oriented. Wt Readings from Last 3 Encounters:  08/27/21 101 lb 4 oz (45.9 kg)  06/24/21 111 lb (50.3 kg)  06/04/21 107 lb (48.5 kg)    height is '4\' 9"'$  (1.448 m) and weight is 101 lb 4 oz (45.9 kg). Her temporal temperature  is 97.6 F (36.4 C). Her blood pressure is 119/66 and her pulse is 96. Her respiration is 20 and oxygen saturation is 100%. .  General: Alert and oriented, in no acute distress; resting head treumor HEENT: Head is normocephalic. Extraocular movements are intact. Oropharynx is notable for no lesions Neck: Neck is notable for no adenopathy  Skin: Skin in treatment fields shows satisfactory healing   Lymphatics: see Neck Exam Psychiatric: Judgment and insight are intact. Affect is appropriate.   Lab Findings: Lab Results  Component Value Date   WBC 5.5 06/04/2021   HGB 14.0 06/04/2021   HCT 43.0 06/04/2021   MCV 88.3 06/04/2021   PLT 214 06/04/2021    Lab Results  Component Value Date   TSH 0.975 07/01/2021    Radiographic Findings: No results found.  Impression/Plan:    1) Head and Neck Cancer Status: healing from RT  2) Nutritional Status: stabilizing weight PEG tube: none  3) Risk Factors: The patient has been educated about risk factors including alcohol and tobacco abuse; they understand that avoidance of alcohol and tobacco is important to prevent recurrences as well as other cancers; still smoking  4) Swallowing: functional, continue SLP exercises  5)  Thyroid function:  check annually. Lab Results  Component Value Date   TSH 0.975 07/01/2021    7) Other: will refer back to ENT in early July  8) Follow-up in rad/onc in early Oct. The patient was encouraged to call with any issues or questions before then.  On date of service, in total, I spent 25 minutes on this encounter. Patient was seen in person. _____________________________________   Eppie Gibson, MD

## 2021-08-27 NOTE — Progress Notes (Signed)
Patient did not show for schedule nutrition appointment.

## 2021-08-27 NOTE — Progress Notes (Signed)
Elizabeth Velazquez presents today for follow-up after completing radiation to her larynx on 07/25/2021  Pain issues, if any: Yes,patient states she has a sore throat. Rating 8/10. Patient reports taking oxycodone/apap  7.5 mg-'325mg'$ which is effective.  Using a feeding tube?: N/A Weight changes, if any:  Wt Readings from Last 3 Encounters:  08/27/21 101 lb 4 oz (45.9 kg)  06/24/21 111 lb (50.3 kg)  06/04/21 107 lb (48.5 kg)   Patient reports appetite is improving.   Swallowing issues, if any: Yes, continues to use lidocaine.  Smoking or chewing tobacco? Yes patient continues to smoke.  Using fluoride trays daily? NA Last ENT visit was on: Not since diagnosis Other notable issues, if any: Patient reports throat continues to swell. Patient states she thinks the swelling is related to her smoking.   BP 119/66 (BP Location: Left Arm, Patient Position: Sitting)   Pulse 96   Temp 97.6 F (36.4 C) (Temporal)   Resp 20   Ht '4\' 9"'$  (1.448 m)   Wt 101 lb 4 oz (45.9 kg)   SpO2 100%   BMI 21.91 kg/m

## 2021-08-28 ENCOUNTER — Telehealth: Payer: Self-pay | Admitting: *Deleted

## 2021-08-28 NOTE — Telephone Encounter (Signed)
CALLED PATIENT'S DAUGHTER- ANGEL TO ASK ABOUT SCHEDULING EARLY OCT. FU FOR HER MOM, PATIENT'S DAUGHTER- ANGEL AGREED TO BRING HER MOM ON 01-07-22 @ 11:40 AM

## 2021-08-28 NOTE — Progress Notes (Signed)
Oncology Nurse Navigator Documentation   Per patient's 08/27/21 post-treatment follow-up with Dr. Isidore Moos, sent fax to [facility] ENT Scheduling with request Ms. Karger be contacted and scheduled for routine post-RT follow-up in early July with Dr. Fredric Dine.  Notification of successful fax transmission received.   Harlow Asa RN, BSN, OCN Head & Neck Oncology Nurse [place] at [facility] Phone # [phone]  Fax # [phone]

## 2021-09-10 NOTE — Progress Notes (Signed)
                                                                                                                                                             Patient Name: Elizabeth Velazquez MRN: [id number] DOB: 24-Nov-1940 Referring Physician: Ebbie Latus A Date of Service: 07/25/2021 [facility], [place]                                                        End Of Treatment Note  Diagnoses: C32.0-Malignant neoplasm of glottis  Cancer Staging:  Cancer Staging  Glottis carcinoma [facility]) Staging form: Larynx - Glottis, AJCC 8th Edition - Clinical stage from 06/18/2021: Stage I (cT1b, cN0, cM0) - Signed by Eppie Gibson, MD on 06/18/2021 Stage prefix: Initial diagnosis   Intent: Curative  Radiation Treatment Dates: 06/30/2021 through 07/25/2021 Site Technique Total Dose (Gy) Dose per Fx (Gy) Completed Fx Beam Energies  Larynx: HN_larynx 3D 55/55 2.75 20/20 6X   Narrative: The patient tolerated radiation therapy relatively well.   Plan: The patient will follow-up with radiation oncology in 2-3 wks -----------------------------------  Eppie Gibson, MD

## 2021-09-22 DIAGNOSIS — M5442 Lumbago with sciatica, left side: Secondary | ICD-10-CM | POA: Diagnosis not present

## 2021-09-22 DIAGNOSIS — K5903 Drug induced constipation: Secondary | ICD-10-CM | POA: Diagnosis not present

## 2021-09-22 DIAGNOSIS — Z79891 Long term (current) use of opiate analgesic: Secondary | ICD-10-CM | POA: Diagnosis not present

## 2021-09-22 DIAGNOSIS — M542 Cervicalgia: Secondary | ICD-10-CM | POA: Diagnosis not present

## 2021-09-23 ENCOUNTER — Emergency Department (HOSPITAL_COMMUNITY): Payer: Medicare Other

## 2021-09-23 ENCOUNTER — Encounter (HOSPITAL_COMMUNITY): Payer: Self-pay | Admitting: Emergency Medicine

## 2021-09-23 ENCOUNTER — Emergency Department (HOSPITAL_COMMUNITY)
Admission: EM | Admit: 2021-09-23 | Discharge: 2021-09-23 | Disposition: A | Discharge status: Home / Self Care | Payer: Medicare Other | Attending: Emergency Medicine | Admitting: Emergency Medicine

## 2021-09-23 ENCOUNTER — Other Ambulatory Visit: Payer: Self-pay

## 2021-09-23 DIAGNOSIS — J0101 Acute recurrent maxillary sinusitis: Secondary | ICD-10-CM | POA: Diagnosis not present

## 2021-09-23 DIAGNOSIS — Z79899 Other long term (current) drug therapy: Secondary | ICD-10-CM | POA: Insufficient documentation

## 2021-09-23 DIAGNOSIS — M47812 Spondylosis without myelopathy or radiculopathy, cervical region: Secondary | ICD-10-CM | POA: Diagnosis not present

## 2021-09-23 DIAGNOSIS — R911 Solitary pulmonary nodule: Secondary | ICD-10-CM | POA: Diagnosis not present

## 2021-09-23 DIAGNOSIS — R5383 Other fatigue: Secondary | ICD-10-CM | POA: Diagnosis not present

## 2021-09-23 DIAGNOSIS — R059 Cough, unspecified: Secondary | ICD-10-CM | POA: Diagnosis not present

## 2021-09-23 DIAGNOSIS — J439 Emphysema, unspecified: Secondary | ICD-10-CM | POA: Diagnosis not present

## 2021-09-23 DIAGNOSIS — R0989 Other specified symptoms and signs involving the circulatory and respiratory systems: Secondary | ICD-10-CM | POA: Diagnosis not present

## 2021-09-23 DIAGNOSIS — R0602 Shortness of breath: Secondary | ICD-10-CM | POA: Diagnosis not present

## 2021-09-23 DIAGNOSIS — Z20822 Contact with and (suspected) exposure to covid-19: Secondary | ICD-10-CM | POA: Insufficient documentation

## 2021-09-23 DIAGNOSIS — J441 Chronic obstructive pulmonary disease with (acute) exacerbation: Secondary | ICD-10-CM | POA: Diagnosis not present

## 2021-09-23 DIAGNOSIS — J0100 Acute maxillary sinusitis, unspecified: Secondary | ICD-10-CM

## 2021-09-23 DIAGNOSIS — Z7982 Long term (current) use of aspirin: Secondary | ICD-10-CM | POA: Insufficient documentation

## 2021-09-23 DIAGNOSIS — I672 Cerebral atherosclerosis: Secondary | ICD-10-CM | POA: Diagnosis not present

## 2021-09-23 DIAGNOSIS — J320 Chronic maxillary sinusitis: Secondary | ICD-10-CM | POA: Diagnosis not present

## 2021-09-23 DIAGNOSIS — R630 Anorexia: Secondary | ICD-10-CM | POA: Insufficient documentation

## 2021-09-23 DIAGNOSIS — R0902 Hypoxemia: Secondary | ICD-10-CM | POA: Diagnosis not present

## 2021-09-23 LAB — RESP PANEL BY RT-PCR (FLU A&B, COVID) ARPGX2
Influenza A by PCR: NEGATIVE
Influenza B by PCR: NEGATIVE
SARS Coronavirus 2 by RT PCR: NEGATIVE

## 2021-09-23 LAB — COMPREHENSIVE METABOLIC PANEL
ALT: 12 U/L (ref 0–44)
AST: 18 U/L (ref 15–41)
Albumin: 3.6 g/dL (ref 3.5–5.0)
Alkaline Phosphatase: 61 U/L (ref 38–126)
Anion gap: 8 (ref 5–15)
BUN: 13 mg/dL (ref 8–23)
CO2: 32 mmol/L (ref 22–32)
Calcium: 8.8 mg/dL — ABNORMAL LOW (ref 8.9–10.3)
Chloride: 104 mmol/L (ref 98–111)
Creatinine, Ser: 0.42 mg/dL — ABNORMAL LOW (ref 0.44–1.00)
GFR, Estimated: >60 mL/min (ref >60)
Glucose, Bld: 121 mg/dL — ABNORMAL HIGH (ref 70–99)
Potassium: 3.3 mmol/L — ABNORMAL LOW (ref 3.5–5.1)
Sodium: 144 mmol/L (ref 135–145)
Total Bilirubin: 0.6 mg/dL (ref 0.3–1.2)
Total Protein: 6.9 g/dL (ref 6.5–8.1)

## 2021-09-23 LAB — TROPONIN I (HIGH SENSITIVITY): Troponin I (High Sensitivity): 6 ng/L (ref <18)

## 2021-09-23 LAB — CBC WITH DIFFERENTIAL/PLATELET
Abs Immature Granulocytes: 0.02 10*3/uL (ref 0.00–0.07)
Basophils Absolute: 0 10*3/uL (ref 0.0–0.1)
Basophils Relative: 0 %
Eosinophils Absolute: 0.1 10*3/uL (ref 0.0–0.5)
Eosinophils Relative: 2 %
HCT: 43.7 % (ref 36.0–46.0)
Hemoglobin: 13.9 g/dL (ref 12.0–15.0)
Immature Granulocytes: 0 %
Lymphocytes Relative: 17 %
Lymphs Abs: 0.9 10*3/uL (ref 0.7–4.0)
MCH: 27.6 pg (ref 26.0–34.0)
MCHC: 31.8 g/dL (ref 30.0–36.0)
MCV: 86.9 fL (ref 80.0–100.0)
Monocytes Absolute: 0.4 10*3/uL (ref 0.1–1.0)
Monocytes Relative: 9 %
Neutro Abs: 3.6 10*3/uL (ref 1.7–7.7)
Neutrophils Relative %: 72 %
Platelets: 235 10*3/uL (ref 150–400)
RBC: 5.03 MIL/uL (ref 3.87–5.11)
RDW: 13.7 % (ref 11.5–15.5)
WBC: 5 10*3/uL (ref 4.0–10.5)
nRBC: 0 % (ref 0.0–0.2)

## 2021-09-23 LAB — BRAIN NATRIURETIC PEPTIDE: B Natriuretic Peptide: 73.7 pg/mL (ref 0.0–100.0)

## 2021-09-23 MED ORDER — METHYLPREDNISOLONE SODIUM SUCC 125 MG IJ SOLR
125.0000 mg | Freq: Once | INTRAMUSCULAR | Status: AC
  Administered 2021-09-23: 125 mg via INTRAVENOUS
  Filled 2021-09-23: qty 2

## 2021-09-23 MED ORDER — IPRATROPIUM-ALBUTEROL 0.5-2.5 (3) MG/3ML IN SOLN
3.0000 mL | Freq: Once | RESPIRATORY_TRACT | Status: AC
  Administered 2021-09-23: 3 mL via RESPIRATORY_TRACT
  Filled 2021-09-23: qty 3

## 2021-09-23 MED ORDER — PREDNISONE 10 MG PO TABS: 40.0000 mg | ORAL_TABLET | Freq: Every day | ORAL | 0 refills | Status: AC

## 2021-09-23 MED ORDER — IOHEXOL 350 MG/ML SOLN
80.0000 mL | Freq: Once | INTRAVENOUS | Status: AC | PRN
  Administered 2021-09-23: 75 mL via INTRAVENOUS

## 2021-09-23 MED ORDER — DOXYCYCLINE HYCLATE 100 MG PO CAPS: 100.0000 mg | ORAL_CAPSULE | Freq: Two times a day (BID) | ORAL | 0 refills | Status: DC

## 2021-09-23 NOTE — ED Triage Notes (Signed)
Per [facility] pt coming from home for shortness of breath and cough x 1 week. Patient states shortness of breath worsening last night. Has home oxygen as needed and has been on 4L [place] the past two days. Reports productive cough with clear mucus. Patient reports "feeling like I am choking." Patient A&0x4.

## 2021-09-23 NOTE — ED Notes (Signed)
Pt stated at 93% on room air while ambulating pt stated she wants to go home

## 2021-09-23 NOTE — ED Provider Notes (Signed)
[facility] DEPT Provider Note   CSN: [id number] Arrival date & time: 09/23/21  1314     History  Chief Complaint  Patient presents with   Shortness of Breath   Cough    Elizabeth Velazquez is a 82 y.o. 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  Symptoms have going on for the past 2 weeks but worsened in the past 2 days.  Reports cough productive with clear mucus and shortness of breath at all times but worse at night.  She reports chills but no documented fevers.  She reports decreased appetite and fatigue.  No abdominal pain, chest pain, vomiting or diarrhea.  No leg swelling.  No history of DVT or PE.  Patient has as needed supplemental oxygen at home and has been using 4 L for the past 2 days due to her symptoms.   Shortness of Breath Associated symptoms: cough   Associated symptoms: no abdominal pain, no chest pain, no ear pain, no fever, no rash, no sore throat, no vomiting and no wheezing   Cough Associated symptoms: chills and shortness of breath   Associated symptoms: no chest pain, no ear pain, no fever, no myalgias, no rash, no rhinorrhea, no sore throat and no wheezing        Home Medications Prior to Admission medications   Medication Sig Start Date End Date Taking? Authorizing Provider  albuterol (PROVENTIL) (2.5 MG/3ML) 0.083% nebulizer solution Take 2.5 mg by nebulization every 6 (six) hours as needed for shortness of breath.    Yes [provider]  doxycycline (VIBRAMYCIN) 100 MG capsule Take 1 capsule (100 mg total) by mouth 2 (two) times daily. 09/23/21  Yes Leya Paige, PA-C  predniSONE (DELTASONE) 10 MG tablet Take 4 tablets (40 mg total) by mouth daily for 4 days. 09/23/21 09/27/21 Yes Elexus Barman, PA-C  alendronate (FOSAMAX) 70 MG tablet Take 70 mg by mouth once a week. Take with a full  glass of water on an empty stomach.    [provider]  amLODipine (NORVASC) 5 MG tablet TAKE 1 TABLET(5 MG) BY MOUTH DAILY 06/20/21   Patwardhan, Reynold Bowen, MD  aspirin EC 81 MG tablet Take 1 tablet (81 mg total) by mouth daily. 04/10/19   Patwardhan, Reynold Bowen, MD  atorvastatin (LIPITOR) 20 MG tablet Take 20 mg by mouth daily.    [provider]  baclofen (LIORESAL) 10 MG tablet Take 10 mg by mouth 3 (three) times daily as needed for muscle spasms.  12/02/18   [provider]  buPROPion (WELLBUTRIN SR) 150 MG 12 hr tablet Start one week before quit date. Take 1 tab daily x 3 days, then 1 tab BID thereafter. 06/24/21   Eppie Gibson, MD  Cholecalciferol (VITAMIN D3) 125 MCG (5000 UT) TABS Take 5,000 Units by mouth daily.    [provider]  docusate sodium (COLACE) 100 MG capsule Take 100 mg by mouth at bedtime as needed for mild constipation.    [provider]  gabapentin (NEURONTIN) 300 MG capsule Take 300 mg by mouth 3 (three) times daily. 06/21/21   [provider]  HYDROcodone-acetaminophen (NORCO) 10-325 MG tablet Take 1 tablet by mouth 4 (four) times daily as needed. 06/06/21   [provider]  ibuprofen (ADVIL) 200 MG tablet Take 400 mg by mouth 2 (two) times daily.  [provider]  lidocaine (XYLOCAINE) 2 % solution Patient: Mix 1part 2% viscous lidocaine, 1part H20. Swallow 70m of diluted mixture, 331m before meals and at bedtime, up to QID. (Try 84m51mor first dose due to unclear possible allergy; have family present). 07/14/21   SquEppie GibsonD  nicotine (NICODERM CQ - DOSED IN MG/24 HOURS) 14 mg/24hr patch Place 1 patch (14 mg total) onto the skin daily. Patient not taking: Reported on 08/27/2021 06/02/21   Patwardhan, ManReynold BowenD  nicotine (NICODERM CQ - DOSED IN MG/24 HOURS) 14 mg/24hr patch Place 1 patch (14 mg total) onto the skin daily. Apply 21 mg patch daily x 6 wk, then '14mg'$  patch daily x 2 wk, then 7 mg patch  daily x 2 wk Patient not taking: Reported on 08/27/2021 06/24/21   SquEppie GibsonD  nicotine (NICODERM CQ - DOSED IN MG/24 HOURS) 21 mg/24hr patch Place 1 patch (21 mg total) onto the skin daily. Apply 21 mg patch daily x 6 wk, then '14mg'$  patch daily x 2 wk, then 7 mg patch daily x 2 wk Patient not taking: Reported on 08/27/2021 06/24/21   SquEppie GibsonD  nicotine (NICODERM CQ - DOSED IN MG/24 HR) 7 mg/24hr patch Place 1 patch (7 mg total) onto the skin daily. Apply 21 mg patch daily x 6 wk, then '14mg'$  patch daily x 2 wk, then 7 mg patch daily x 2 wk Patient not taking: Reported on 08/27/2021 06/24/21   SquEppie GibsonD  nicotine polacrilex (NICORETTE) 2 MG gum Take 1 each (2 mg total) by mouth as needed for smoking cessation. Patient not taking: Reported on 08/27/2021 06/02/21   PatNigel MormonD  omeprazole (PRILOSEC) 40 MG capsule 40 mg 2 (two) times daily.    [provider]  oxyCODONE-acetaminophen (PERCOCET) 7.5-325 MG tablet Take 1 tablet by mouth every 6 (six) hours as needed for severe pain.    [provider]  OXYGEN Inhale 3.5 L into the lungs daily as needed (Low oxygen).    [provider]  senna-docusate (SENOKOT-S) 8.6-50 MG tablet Take 2 tablets by mouth at bedtime as needed for mild constipation.    [provider]  sucralfate (CARAFATE) 1 g tablet Dissolve 1 tablet in 10 mL H20 and swallow 30 min prior to meals and bedtime, PRN sore throat 06/30/21   SquEppie GibsonD  SYMBICORT 160-4.5 MCG/ACT inhaler Inhale 2 puffs into the lungs daily as needed (Breathing/COPD/emphysema). 10/25/18   [provider]  Tiotropium Bromide Monohydrate (SPIRIVA RESPIMAT) 2.5 MCG/ACT AERS Inhale 2 puffs into the lungs daily. Patient taking differently: Inhale 1 puff into the lungs daily. 03/13/21   DewFreddi StarrD  TURMERIC PO Take 1 tablet by mouth daily as needed (If feel like getting sick).    [provider]  venlafaxine XR (EFFEXOR-XR)  150 MG 24 hr capsule Take 150 mg by mouth daily with breakfast.    [provider]  zolpidem (AMBIEN) 5 MG tablet Take 5 mg by mouth at bedtime. 01/28/21   [provider]      Allergies    Azithromycin, Bee venom, Gabapentin, Lidocaine, Penicillins, and Serotonin reuptake inhibitors (ssris)    Review of Systems   Review of Systems  Constitutional:  Positive for appetite change, chills and fatigue. Negative for fever.  HENT:  Negative for ear pain, rhinorrhea, sneezing and sore throat.   Eyes:  Negative for photophobia and visual disturbance.  Respiratory:  Positive for cough and  shortness of breath. Negative for chest tightness and wheezing.   Cardiovascular:  Negative for chest pain and palpitations.  Gastrointestinal:  Negative for abdominal pain, blood in stool, constipation, diarrhea, nausea and vomiting.  Genitourinary:  Negative for dysuria, hematuria and urgency.  Musculoskeletal:  Negative for myalgias.  Skin:  Negative for rash.  Neurological:  Negative for dizziness, weakness and light-headedness.    Physical Exam Updated Vital Signs BP (!) 151/95   Pulse 95   Temp 97.9 F (36.6 C) (Oral)   Resp (!) 22   Ht '4\' 9"'$  (1.448 m)   Wt 44.9 kg   SpO2 96%   BMI 21.42 kg/m  Physical Exam Vitals and nursing note reviewed.  Constitutional:      General: She is not in acute distress.    Appearance: She is well-developed.  HENT:     Head: Normocephalic and atraumatic.     Nose: Nose normal.  Eyes:     General: No scleral icterus.       Left eye: No discharge.     Conjunctiva/sclera: Conjunctivae normal.  Cardiovascular:     Rate and Rhythm: Normal rate and regular rhythm.     Heart sounds: Normal heart sounds. No murmur heard.    No friction rub. No gallop.  Pulmonary:     Effort: Pulmonary effort is normal. No respiratory distress.     Breath sounds: Normal breath sounds.     Comments: Coarse breath sounds bilateral lung fields. Abdominal:      General: Bowel sounds are normal. There is no distension.     Palpations: Abdomen is soft.     Tenderness: There is no abdominal tenderness. There is no guarding.  Musculoskeletal:        General: Normal range of motion.     Cervical back: Normal range of motion and neck supple.     Comments: No lower extremity edema, erythema or calf tenderness bilaterally.  Skin:    General: Skin is warm and dry.     Findings: No rash.  Neurological:     Mental Status: She is alert.     Motor: No abnormal muscle tone.     Coordination: Coordination normal.     ED Results / Procedures / Treatments   Labs (all labs ordered are listed, but only abnormal results are displayed) Labs Reviewed  COMPREHENSIVE METABOLIC PANEL - Abnormal; Notable for the following components:      Result Value   Potassium 3.3 (*)    Glucose, Bld 121 (*)    Creatinine, Ser 0.42 (*)    Calcium 8.8 (*)    All other components within normal limits  RESP PANEL BY RT-PCR (FLU A&B, COVID) ARPGX2  CBC WITH DIFFERENTIAL/PLATELET  BRAIN NATRIURETIC PEPTIDE  TROPONIN I (HIGH SENSITIVITY)    EKG EKG Interpretation  Date/Time:  Tuesday September 23 2021 13:34:45 EDT Ventricular Rate:  89 PR Interval:  112 QRS Duration: 91 QT Interval:  386 QTC Calculation: 470 R Axis:   70 Text Interpretation: Sinus rhythm Atrial premature complexes Borderline short PR interval Nonspecific T abnrm, anterolateral leads No significant change since last tracing Confirmed by Deno Etienne [phone]) on 09/23/2021 1:37:59 PM  Radiology CT Soft Tissue Neck W Contrast  Result Date: 09/23/2021 CLINICAL DATA:  Provided history: Epiglottitis or tonsillitis suspected. Additional history provided: Patient reports shortness of breath and cough for 1 week. Patient reports feeling as though she is choking. Additional history obtained from prior radiology records: History of glottic carcinoma. EXAM:  CT NECK WITH CONTRAST TECHNIQUE: Multidetector CT imaging of the  neck was performed using the standard protocol following the bolus administration of intravenous contrast. RADIATION DOSE REDUCTION: This exam was performed according to the departmental dose-optimization program which includes automated exposure control, adjustment of the mA and/or kV according to patient size and/or use of iterative reconstruction technique. CONTRAST:  28m OMNIPAQUE IOHEXOL 350 MG/ML SOLN COMPARISON:  None Available. FINDINGS: Mild to moderately motion degraded exam. Pharynx and larynx: Within limitations of motion degradation, no swelling or discrete mass is identified within the oral cavity, pharynx or larynx. The previously demonstrated subtle asymmetric prominence of the anterior left glottis is no longer definitively appreciated. Salivary glands: No inflammation, mass, or stone. Thyroid: Unchanged 11 mm nodule within the thyroid isthmus, not meeting consensus criteria for ultrasound follow-up based on size. Lymph nodes: No pathologically enlarged lymph nodes identified within the neck. Vascular: The major vascular structures of the neck are patent. Atherosclerotic plaque within the visualized aortic arch, proximal major branch vessels of the neck, as well as carotid and vertebral arteries. Atherosclerotic plaque at the right ICA origin could result in a hemodynamically significant stenosis. Limited intracranial: No evidence of acute intracranial abnormality within the field of view. Visualized orbits: No orbital mass or acute orbital finding. Mastoids and visualized paranasal sinuses: Small fluid level within the right maxillary sinus. No significant mastoid effusion. Skeleton: Small sclerotic focus within the C3 vertebral body, chronic, unchanged and likely benign. No acute bony abnormality or aggressive osseous lesion. Mild C2-C3 and C3-C4 grade 1 anterolisthesis. Cervical spondylosis. No appreciable high-grade spinal canal stenosis. Multilevel bony neural foraminal narrowing. Upper chest:  Emphysema. No consolidation within the imaged lung apices. Left upper lobe calcified granuloma. Other: Unchanged 1 cm ovoid low-density cutaneous/subcutaneous lesion within the anterior midline neck at the level of the thyroid cartilage. This is again favored to reflect an epidermal inclusion cyst. IMPRESSION: 1. Mild-to-moderately motion degraded examination. 2. Within the limitations of motion degradation, there is no appreciable swelling or discrete mass within the oral cavity, pharynx or larynx. 3. Unchanged 1 cm ovoid low-density cutaneous/subcutaneous lesion within the anterior midline neck at the level of the thyroid cartilage, favored to reflect an epidermal inclusion cyst. Direct visualization recommended. 4. Atherosclerotic plaque within the visualized aortic arch, proximal major branch vessels of the neck, as well as carotid and vertebral arteries. Atherosclerotic plaque could result in a hemodynamically significant stenosis at the origin of the right ICA (venous contrast timing limits evaluation on the current exam). Consider a carotid artery duplex for further evaluation. 5. Mild right maxillary sinusitis. 6. Cervical spondylosis. 7. Aortic Atherosclerosis (ICD10-I70.0) and Emphysema (ICD10-J43.9). Electronically Signed   By: KKellie SimmeringD.O.   On: 09/23/2021 16:32   CT Angio Chest PE W/Cm &/Or Wo Cm  Result Date: 09/23/2021 CLINICAL DATA:  Cough and shortness of breath for the past week. EXAM: CT ANGIOGRAPHY CHEST WITH CONTRAST TECHNIQUE: Multidetector CT imaging of the chest was performed using the standard protocol during bolus administration of intravenous contrast. Multiplanar CT image reconstructions and MIPs were obtained to evaluate the vascular anatomy. RADIATION DOSE REDUCTION: This exam was performed according to the departmental dose-optimization program which includes automated exposure control, adjustment of the mA and/or kV according to patient size and/or use of iterative  reconstruction technique. CONTRAST:  774mOMNIPAQUE IOHEXOL 350 MG/ML SOLN COMPARISON:  Chest x-ray from same day. CT chest dated May 14, 2021. FINDINGS: Cardiovascular: Satisfactory opacification of the pulmonary arteries to the segmental level. No  evidence of pulmonary embolism. Normal heart size. No pericardial effusion. No thoracic aortic aneurysm or dissection. Coronary, aortic arch, and branch vessel atherosclerotic vascular disease. Mediastinum/Nodes: No enlarged mediastinal, hilar, or axillary lymph nodes. Thyroid gland, trachea, and esophagus demonstrate no significant findings. Lungs/Pleura: Unchanged moderate centrilobular emphysema and chronic diffuse peribronchial thickening. Unchanged 5 mm nodule in the right upper lobe (series 8, image 43). Unchanged 3 and 4 mm nodules in the right middle lobe (series 8, images 86 and 89). Previously seen 6 mm nodule in the anterior left upper lobe currently measures 2 mm (series 8, image 41). Unchanged calcified granuloma in the left upper lobe. No focal consolidation, pleural effusion, or pneumothorax. Upper Abdomen: No acute abnormality. Musculoskeletal: No chest wall abnormality. No acute or significant osseous findings. Review of the MIP images confirms the above findings. IMPRESSION: 1. No evidence of pulmonary embolism. No acute intrathoracic process. 2. Previously seen 6 mm nodule in the anterior left upper lobe currently measures 2 mm, consistent with benign etiology. Other pulmonary nodules in the right lung measuring up to 5 mm are unchanged dating back to 2019, consistent with benign etiology. 3. Aortic Atherosclerosis (ICD10-I70.0) and Emphysema (ICD10-J43.9). Electronically Signed   By: Titus Dubin M.D.   On: 09/23/2021 16:13   DG Chest 2 View  Result Date: 09/23/2021 CLINICAL DATA:  sob EXAM: CHEST - 2 VIEW COMPARISON:  May 19, 2017 FINDINGS: The heart size and mediastinal contours are within normal limits. Thoracic aorta is ectatic  with atheromatous calcifications at the arch of the aorta. No consolidation, pleural effusion or significant vascular congestion. Stable chronic interstitial prominence. The visualized skeletal structures are unremarkable. IMPRESSION: No active cardiopulmonary disease. Electronically Signed   By: Frazier Richards M.D.   On: 09/23/2021 14:07    Procedures Procedures    Medications Ordered in ED Medications  ipratropium-albuterol (DUONEB) 0.5-2.5 (3) MG/3ML nebulizer solution 3 mL (3 mLs Nebulization Given 09/23/21 1425)  iohexol (OMNIPAQUE) 350 MG/ML injection 80 mL (75 mLs Intravenous Contrast Given 09/23/21 1543)  ipratropium-albuterol (DUONEB) 0.5-2.5 (3) MG/3ML nebulizer solution 3 mL (3 mLs Nebulization Given 09/23/21 1732)  methylPREDNISolone sodium succinate (SOLU-MEDROL) 125 mg/2 mL injection 125 mg (125 mg Intravenous Given 09/23/21 1732)    ED Course/ Medical Decision Making/ A&P Clinical Course as of 09/23/21 1800  Tue Sep 23, 2021  1411 WBC: 5.0 [HK]  1416 DG Chest 2 View No acute findings. [HK]  7619 Patient with only slight improvement after DuoNeb. Lungs still sound diminished. Will order CT angio to rule out PE as well as a CT scan of the soft tissues of the neck due to her history of cancer and current symptoms. [HK]  1511 Resp Panel by RT-PCR (Flu A&B, Covid) Anterior Nasal Swab Negative. [HK]  1540 Troponin I (High Sensitivity): 6 [HK]  1548 B Natriuretic Peptide: 73.7 [HK]    Clinical Course User Index [HK] Delia Heady, PA-C                           Medical Decision Making Amount and/or Complexity of Data Reviewed Labs: ordered. Decision-making details documented in ED Course. Radiology: ordered. Decision-making details documented in ED Course.  Risk Prescription drug management.   81 year old female with a past medical history of glottis carcinoma status post radiation therapy completed 2 months ago presenting to the ED with a chief complaint of shortness of  breath and cough.  Symptoms present for 2 weeks but worsened in the past  2 days.  Reports associated fatigue, decreased appetite, chills. She denies leg swelling or history of DVT/PE. On exam she has diminished breath sounds bilaterally.  She is on 2 L of oxygen via nasal cannula with oxygen saturations in the mid 90s.  Apparently upon arrival her oxygen saturations remained in the low 90s on room air.  She has no lower extremity edema, erythema or calf tenderness I would concern me for DVT.  She is tachypneic but not tachycardic.  EKG here shows sinus rhythm, no changes from prior tracings, no STEMI.  Chest x-ray without any acute findings.  CBC is unremarkable.  BNP is normal.  Troponin is negative x1.  CMP is unremarkable.  Patient was given DuoNeb with only minimal improvement in her symptoms.  CT angio of the chest was done to rule out PE.  This shows no evidence of PE, or other acute or concerning process.  She has a lung nodule on the left lung that has decreased in size.  Also did a CT scan soft tissue of her neck due to her history of cancer in this area as well as her symptoms.  Patient is not stridorous on exam.  No concerning findings on the CT scan of her neck.  There is also an incidental finding of a epidermal inclusion cyst which was present in her prior scans as well as an atherosclerotic plaque which patient was informed about.  She was given additional DuoNeb and Solu-Medrol.  I had a discussion with the patient regarding admission for her symptoms based on her history, physical exam findings and minimal improvement here.  Patient is adamant that she does not want to be admitted to the hospital.  Informed her that her symptoms could worsen when she gets home, explained benefits of admission to the hospital and risk of being discharged without adequate treatment.  She insist that she has oxygen and breathing treatments at home that she would like to use.  She does not want to be admitted to the  hospital.  Family members at bedside as well for this discussion and they agree to manage her symptoms at home based on patient's preference.  I will prescribe her antibiotics, remainder of steroid course.  I suspect that her symptoms are due to a COPD exacerbation.  She knows to return to the ER for any severe worsening symptoms.  At the time of discharge her oxygen saturations are in the low 90s on room air and her lung sounds have slightly improved with the second DuoNeb treatment.  She is requesting discharge at this time.    Patient is hemodynamically stable, in NAD, and able to ambulate in the ED. Evaluation does not show pathology that would require ongoing emergent intervention or inpatient treatment. I explained the diagnosis to the patient. Pain has been managed and has no complaints prior to discharge. Patient is comfortable with above plan and is stable for discharge at this time. All questions were answered prior to disposition. Strict return precautions for returning to the ED were discussed. Encouraged follow up with PCP.   An After Visit Summary was printed and given to the patient.   Portions of this note were generated with [employer]. Dictation errors may occur despite best attempts at proofreading.         Final Clinical Impression(s) / ED Diagnoses Final diagnoses:  COPD exacerbation ([place])  Acute maxillary sinusitis, recurrence not specified    Rx / DC Orders ED Discharge Orders  Ordered    doxycycline (VIBRAMYCIN) 100 MG capsule  2 times daily        09/23/21 1756    predniSONE (DELTASONE) 10 MG tablet  Daily        09/23/21 1756              Delia Heady, PA-C 09/23/21 1800    Deno Etienne, DO 09/24/21 0830

## 2021-09-23 NOTE — ED Notes (Addendum)
Pt stated she no longer felt like she needed any supplemental 02. Same was modified and no distress was noted. She stated she felt a lot better at this time.

## 2021-09-23 NOTE — Discharge Instructions (Addendum)
Your CT scan of your chest did not show any evidence of a pulmonary embolism or pneumonia.  You do have a lung nodule that was previously present but now decreased in size. The CT scan of your neck again redemonstrated a possible inclusion cyst in your neck at the level of your thyroid which is unchanged from your prior imaging. It also showed some atherosclerotic changes in your aortic arch.  You will need to inform your primary care provider about this we will need to order carotid artery duplex. I suspect that your symptoms are likely due to a COPD exacerbation. I recommended that you stay in the hospital for management of your symptoms and to evaluate for improvement during admission.  However you decided to go home.  You will need to use the oxygen at home as needed, begin taking the steroids (prednisone) tomorrow and continue using your nebulizer machine and inhalers. Take the antibiotics today. If you feel worse at any point or any developing new symptoms like chest pain, coughing up blood, leg swelling, uncontrollable vomiting or fever you can return to the ER at any time.

## 2021-09-23 NOTE — ED Notes (Signed)
ED Provider at bedside. 

## 2021-10-20 DIAGNOSIS — Z8521 Personal history of malignant neoplasm of larynx: Secondary | ICD-10-CM | POA: Diagnosis not present

## 2021-10-20 DIAGNOSIS — F17210 Nicotine dependence, cigarettes, uncomplicated: Secondary | ICD-10-CM | POA: Diagnosis not present

## 2021-10-20 DIAGNOSIS — M5442 Lumbago with sciatica, left side: Secondary | ICD-10-CM | POA: Diagnosis not present

## 2021-10-20 DIAGNOSIS — Z923 Personal history of irradiation: Secondary | ICD-10-CM | POA: Diagnosis not present

## 2021-10-20 DIAGNOSIS — M542 Cervicalgia: Secondary | ICD-10-CM | POA: Diagnosis not present

## 2021-10-20 DIAGNOSIS — G894 Chronic pain syndrome: Secondary | ICD-10-CM | POA: Diagnosis not present

## 2021-10-20 DIAGNOSIS — Z79891 Long term (current) use of opiate analgesic: Secondary | ICD-10-CM | POA: Diagnosis not present

## 2021-11-03 DIAGNOSIS — I25111 Atherosclerotic heart disease of native coronary artery with angina pectoris with documented spasm: Secondary | ICD-10-CM | POA: Diagnosis not present

## 2021-11-03 DIAGNOSIS — E7800 Pure hypercholesterolemia, unspecified: Secondary | ICD-10-CM | POA: Diagnosis not present

## 2021-11-03 DIAGNOSIS — I10 Essential (primary) hypertension: Secondary | ICD-10-CM | POA: Diagnosis not present

## 2021-11-03 DIAGNOSIS — J449 Chronic obstructive pulmonary disease, unspecified: Secondary | ICD-10-CM | POA: Diagnosis not present

## 2021-11-04 ENCOUNTER — Other Ambulatory Visit: Payer: Medicare Other

## 2021-11-06 ENCOUNTER — Telehealth: Payer: Self-pay | Admitting: Pulmonary Disease

## 2021-11-13 DIAGNOSIS — C320 Malignant neoplasm of glottis: Secondary | ICD-10-CM | POA: Diagnosis not present

## 2021-11-13 DIAGNOSIS — E559 Vitamin D deficiency, unspecified: Secondary | ICD-10-CM | POA: Diagnosis not present

## 2021-11-13 DIAGNOSIS — I10 Essential (primary) hypertension: Secondary | ICD-10-CM | POA: Diagnosis not present

## 2021-11-13 DIAGNOSIS — R300 Dysuria: Secondary | ICD-10-CM | POA: Diagnosis not present

## 2021-11-13 NOTE — Telephone Encounter (Signed)
This ct was cancelled due to patient having another one else where

## 2021-11-24 DIAGNOSIS — M542 Cervicalgia: Secondary | ICD-10-CM | POA: Diagnosis not present

## 2021-11-24 DIAGNOSIS — M5442 Lumbago with sciatica, left side: Secondary | ICD-10-CM | POA: Diagnosis not present

## 2021-11-24 DIAGNOSIS — Z79891 Long term (current) use of opiate analgesic: Secondary | ICD-10-CM | POA: Diagnosis not present

## 2021-11-24 DIAGNOSIS — G894 Chronic pain syndrome: Secondary | ICD-10-CM | POA: Diagnosis not present

## 2021-12-01 ENCOUNTER — Ambulatory Visit: Payer: Medicare Other | Admitting: Pulmonary Disease

## 2021-12-01 NOTE — Progress Notes (Deleted)
Synopsis: Referred in December 2022 for COPD by Deland Pretty, MD  Subjective:   PATIENT ID: [name] GENDER: female DOB: [birth year], MRN: [id number]  HPI  No chief complaint on file.  Gracious Renken is an 81 year old woman, daily smoker with GERD who returns to pulmonary clinic for COPD.   Her breathing improved after last visit with prednisone taper along with using symbicort inhaler 2 puffs twice daily (instead of as needed) along with spiriva 2.26mg 2 puffs daily.  She is undergoing evaluation by Dr. SFredric Dineof ENT for hoarseness of her voice and concern for abnormal lesion on her vocal cords.  CT Chest scan 05/14/21 show new 6.589mleft upper lobe nodule with spiculated border. Other noted pulmonary nodules remain stable. Moderate emphysematous changes and bronchial wall thickening again noted.  OV 03/13/2022 Patient reports hoarseness and loss of her voice a few months ago.  She also has significant shortness of breath especially with exertion.  She does have intermittent wheezing and daily cough with sputum production.  She does have a nebulizer machine at home and is not using it much.  She is using Symbicort 160-4.5 MCG 1 puff in the morning and another puff around midday.  She is smoking 1 to 2 packs/day and has been smoking for 65 years.  She had a CT chest lung cancer screening scan in 2019 which showed moderate bullous type emphysema and lower lobe predominant bronchial thickening.  Past Medical History:  Diagnosis Date   Anxiety    Arthritis    COPD (chronic obstructive pulmonary disease) (HCC)    Coronary artery disease    Depression    Dysphagia    Dyspnea    GERD (gastroesophageal reflux disease)    History of hiatal hernia    Hyperlipidemia    Hypertension    Pneumonia    Shoulder fracture, left    Skin cancer    Wears dentures    Wears glasses      Family History  Problem Relation Age of Onset   Stroke Mother    Alcohol abuse Father    Heart  attack Father    Heart disease Father    Stomach cancer Paternal Grandmother      Social History   Socioeconomic History   Marital status: Widowed    Spouse name: Not on file   Number of children: 2   Years of education: Not on file   Highest education level: Not on file  Occupational History   Not on file  Tobacco Use   Smoking status: Every Day    Packs/day: 2.00    Years: 65.00    Total pack years: 130.00    Types: Cigarettes   Smokeless tobacco: Never  Vaping Use   Vaping Use: Never used  Substance and Sexual Activity   Alcohol use: No   Drug use: No   Sexual activity: Not Currently    Birth control/protection: Surgical  Other Topics Concern   Not on file  Social History Narrative   Not on file   Social Determinants of Health   Financial Resource Strain: Not on file  Food Insecurity: Not on file  Transportation Needs: Not on file  Physical Activity: Not on file  Stress: Not on file  Social Connections: Not on file  Intimate Partner Violence: Not on file     Allergies  Allergen Reactions   Azithromycin     Other reaction(s): heart races   Bee Venom  Other reaction(s): anaphylaxis   Gabapentin     Other reaction(s): dizziness   Lidocaine     Other reaction(s): Unknown   Penicillins     Did it involve swelling of the face/tongue/throat, SOB, or low BP? Unknown Did it involve sudden or severe rash/hives, skin peeling, or any reaction on the inside of your mouth or nose? Unknown Did you need to seek medical attention at a hospital or doctor's office? Unknown When did it last happen?      childhood If all above answers are "NO", may proceed with cephalosporin use.    Serotonin Reuptake Inhibitors (Ssris)     Other reaction(s): thoughts about hurting herself     Outpatient Medications Prior to Visit  Medication Sig Dispense Refill   albuterol (PROVENTIL) (2.5 MG/3ML) 0.083% nebulizer solution Take 2.5 mg by nebulization every 6 (six) hours as needed  for shortness of breath.      alendronate (FOSAMAX) 70 MG tablet Take 70 mg by mouth once a week. Take with a full glass of water on an empty stomach.     amLODipine (NORVASC) 5 MG tablet TAKE 1 TABLET(5 MG) BY MOUTH DAILY 90 tablet 1   aspirin EC 81 MG tablet Take 1 tablet (81 mg total) by mouth daily. 90 tablet 3   atorvastatin (LIPITOR) 20 MG tablet Take 20 mg by mouth daily.     baclofen (LIORESAL) 10 MG tablet Take 10 mg by mouth 3 (three) times daily as needed for muscle spasms.      buPROPion (WELLBUTRIN SR) 150 MG 12 hr tablet Start one week before quit date. Take 1 tab daily x 3 days, then 1 tab BID thereafter. 60 tablet 2   Cholecalciferol (VITAMIN D3) 125 MCG (5000 UT) TABS Take 5,000 Units by mouth daily.     docusate sodium (COLACE) 100 MG capsule Take 100 mg by mouth at bedtime as needed for mild constipation.     doxycycline (VIBRAMYCIN) 100 MG capsule Take 1 capsule (100 mg total) by mouth 2 (two) times daily. 20 capsule 0   gabapentin (NEURONTIN) 300 MG capsule Take 300 mg by mouth 3 (three) times daily.     HYDROcodone-acetaminophen (NORCO) 10-325 MG tablet Take 1 tablet by mouth 4 (four) times daily as needed.     ibuprofen (ADVIL) 200 MG tablet Take 400 mg by mouth 2 (two) times daily.     lidocaine (XYLOCAINE) 2 % solution Patient: Mix 1part 2% viscous lidocaine, 1part H20. Swallow 80m of diluted mixture, 341m before meals and at bedtime, up to QID. (Try 90m3mor first dose due to unclear possible allergy; have family present). 200 mL 3   nicotine (NICODERM CQ - DOSED IN MG/24 HOURS) 14 mg/24hr patch Place 1 patch (14 mg total) onto the skin daily. (Patient not taking: Reported on 08/27/2021) 28 patch 2   nicotine (NICODERM CQ - DOSED IN MG/24 HOURS) 14 mg/24hr patch Place 1 patch (14 mg total) onto the skin daily. Apply 21 mg patch daily x 6 wk, then '14mg'$  patch daily x 2 wk, then 7 mg patch daily x 2 wk (Patient not taking: Reported on 08/27/2021) 14 patch 0   nicotine (NICODERM  CQ - DOSED IN MG/24 HOURS) 21 mg/24hr patch Place 1 patch (21 mg total) onto the skin daily. Apply 21 mg patch daily x 6 wk, then '14mg'$  patch daily x 2 wk, then 7 mg patch daily x 2 wk (Patient not taking: Reported on 08/27/2021) 14 patch 2  nicotine (NICODERM CQ - DOSED IN MG/24 HR) 7 mg/24hr patch Place 1 patch (7 mg total) onto the skin daily. Apply 21 mg patch daily x 6 wk, then '14mg'$  patch daily x 2 wk, then 7 mg patch daily x 2 wk (Patient not taking: Reported on 08/27/2021) 14 patch 0   nicotine polacrilex (NICORETTE) 2 MG gum Take 1 each (2 mg total) by mouth as needed for smoking cessation. (Patient not taking: Reported on 08/27/2021) 100 tablet 2   omeprazole (PRILOSEC) 40 MG capsule 40 mg 2 (two) times daily.     oxyCODONE-acetaminophen (PERCOCET) 7.5-325 MG tablet Take 1 tablet by mouth every 6 (six) hours as needed for severe pain.     OXYGEN Inhale 3.5 L into the lungs daily as needed (Low oxygen).     senna-docusate (SENOKOT-S) 8.6-50 MG tablet Take 2 tablets by mouth at bedtime as needed for mild constipation.     sucralfate (CARAFATE) 1 g tablet Dissolve 1 tablet in 10 mL H20 and swallow 30 min prior to meals and bedtime, PRN sore throat 40 tablet 3   SYMBICORT 160-4.5 MCG/ACT inhaler Inhale 2 puffs into the lungs daily as needed (Breathing/COPD/emphysema).     Tiotropium Bromide Monohydrate (SPIRIVA RESPIMAT) 2.5 MCG/ACT AERS Inhale 2 puffs into the lungs daily. (Patient taking differently: Inhale 1 puff into the lungs daily.) 8 g 0   TURMERIC PO Take 1 tablet by mouth daily as needed (If feel like getting sick).     venlafaxine XR (EFFEXOR-XR) 150 MG 24 hr capsule Take 150 mg by mouth daily with breakfast.     zolpidem (AMBIEN) 5 MG tablet Take 5 mg by mouth at bedtime.     No facility-administered medications prior to visit.   Review of Systems  Constitutional:  Negative for chills, fever, malaise/fatigue and weight loss.  HENT:  Negative for congestion, sinus pain and sore throat.         Hoarseness of voice  Eyes: Negative.   Respiratory:  Negative for cough, hemoptysis, sputum production, shortness of breath and wheezing.   Cardiovascular:  Negative for chest pain, palpitations, orthopnea, claudication and leg swelling.  Gastrointestinal:  Negative for abdominal pain, heartburn, nausea and vomiting.  Genitourinary: Negative.   Musculoskeletal:  Negative for joint pain and myalgias.  Skin:  Negative for rash.  Neurological:  Negative for weakness.  Endo/Heme/Allergies: Negative.   Psychiatric/Behavioral: Negative.      Objective:   There were no vitals filed for this visit.   Physical Exam Constitutional:      General: She is not in acute distress.    Appearance: She is not ill-appearing.  HENT:     Head: Normocephalic and atraumatic.  Eyes:     General: No scleral icterus.    Conjunctiva/sclera: Conjunctivae normal.  Cardiovascular:     Rate and Rhythm: Normal rate and regular rhythm.     Pulses: Normal pulses.     Heart sounds: Normal heart sounds. No murmur heard. Pulmonary:     Effort: Pulmonary effort is normal.     Breath sounds: Decreased air movement present. Decreased breath sounds present. No wheezing, rhonchi or rales.  Musculoskeletal:     Right lower leg: No edema.     Left lower leg: No edema.  Skin:    General: Skin is warm and dry.  Neurological:     General: No focal deficit present.     Mental Status: She is alert.  Psychiatric:  Mood and Affect: Mood normal.        Behavior: Behavior normal.        Thought Content: Thought content normal.        Judgment: Judgment normal.    CBC    Component Value Date/Time   WBC 5.0 09/23/2021 1347   RBC 5.03 09/23/2021 1347   HGB 13.9 09/23/2021 1347   HCT 43.7 09/23/2021 1347   PLT 235 09/23/2021 1347   MCV 86.9 09/23/2021 1347   MCH 27.6 09/23/2021 1347   MCHC 31.8 09/23/2021 1347   RDW 13.7 09/23/2021 1347   LYMPHSABS 0.9 09/23/2021 1347   MONOABS 0.4 09/23/2021 1347    EOSABS 0.1 09/23/2021 1347   BASOSABS 0.0 09/23/2021 1347      Latest Ref Rng & Units 09/23/2021    1:47 PM 06/04/2021    7:52 AM 11/20/2019    4:03 PM  BMP  Glucose 70 - 99 mg/dL 121  120  128   BUN 8 - 23 mg/dL '13  13  16   '$ Creatinine 0.44 - 1.00 mg/dL 0.42  0.56  0.55   Sodium 135 - 145 mmol/L 144  140  139   Potassium 3.5 - 5.1 mmol/L 3.3  3.8  4.0   Chloride 98 - 111 mmol/L 104  105  103   CO2 22 - 32 mmol/L 32  26  29   Calcium 8.9 - 10.3 mg/dL 8.8  8.4  8.5    Chest imaging: CT Chest 09/23/21 1. No evidence of pulmonary embolism. No acute intrathoracic process. 2. Previously seen 6 mm nodule in the anterior left upper lobe currently measures 2 mm, consistent with benign etiology. Other pulmonary nodules in the right lung measuring up to 5 mm are unchanged dating back to 2019, consistent with benign etiology. 3. Aortic Atherosclerosis (ICD10-I70.0) and Emphysema (ICD10-J43.9).  CT Chest 05/14/21 1. New 6.5 mm left upper lobe pulmonary nodule with spiculated and irregular margins, highly suspicious for primary bronchogenic malignancy. This nodule is too small for PET characterization, however is suspicious by CT. Consider multi disciplinary referral for further workup options. 2. All of the additional pulmonary nodules are unchanged from prior exam, largest measuring 4-5 mm, many of which are perifissural or subpleural in location. 3. Moderate emphysema and bronchial thickening. 4. Aortic atherosclerosis. Coronary artery calcifications or stents.  CT Chest LCS 2019 1. Lung-RADS 2, benign appearance or behavior. Continue annual screening with low-dose chest CT without contrast in 12 months. 2. Aortic atherosclerosis, coronary artery atherosclerosis and emphysema  PFT:     No data to display          Labs:  Path:  Echo 04/28/2019: LV normal size and EF 60%. Grade I diastolic dysfunction. Mild mitral regurgitation. Mild tricuspid regurgitation.   Heart  Catheterization:  Assessment & Plan:   No diagnosis found.  Discussion: Ghislaine Harcum is an 81 year old woman, daily smoker with GERD who returns to pulmonary clinic for COPD.   She has radiographic evidence of emphysema along with bronchial wall thickening due to ongoing smoking. I discussed the importance of smoking cessation but she continues to smoke as she is very anxious about the findings of her vocal cords and concern for throat cancer.   She is to continue Symbicort inhaler 2 puffs twice daily and Spiriva 2.5 MCG 2 puffs daily to her regimen.  She can continue to use albuterol inhaler or nebulizer treatments as needed.  We will follow up a CT chest scan  in June for follow up of the new left upper lobe nodule.  Follow-up in 6 months.  Freda Jackson, MD [facility] Office: [phone]   Current Outpatient Medications:    albuterol (PROVENTIL) (2.5 MG/3ML) 0.083% nebulizer solution, Take 2.5 mg by nebulization every 6 (six) hours as needed for shortness of breath. , Disp: , Rfl:    alendronate (FOSAMAX) 70 MG tablet, Take 70 mg by mouth once a week. Take with a full glass of water on an empty stomach., Disp: , Rfl:    amLODipine (NORVASC) 5 MG tablet, TAKE 1 TABLET(5 MG) BY MOUTH DAILY, Disp: 90 tablet, Rfl: 1   aspirin EC 81 MG tablet, Take 1 tablet (81 mg total) by mouth daily., Disp: 90 tablet, Rfl: 3   atorvastatin (LIPITOR) 20 MG tablet, Take 20 mg by mouth daily., Disp: , Rfl:    baclofen (LIORESAL) 10 MG tablet, Take 10 mg by mouth 3 (three) times daily as needed for muscle spasms. , Disp: , Rfl:    buPROPion (WELLBUTRIN SR) 150 MG 12 hr tablet, Start one week before quit date. Take 1 tab daily x 3 days, then 1 tab BID thereafter., Disp: 60 tablet, Rfl: 2   Cholecalciferol (VITAMIN D3) 125 MCG (5000 UT) TABS, Take 5,000 Units by mouth daily., Disp: , Rfl:    docusate sodium (COLACE) 100 MG capsule, Take 100 mg by mouth at bedtime as needed for mild  constipation., Disp: , Rfl:    doxycycline (VIBRAMYCIN) 100 MG capsule, Take 1 capsule (100 mg total) by mouth 2 (two) times daily., Disp: 20 capsule, Rfl: 0   gabapentin (NEURONTIN) 300 MG capsule, Take 300 mg by mouth 3 (three) times daily., Disp: , Rfl:    HYDROcodone-acetaminophen (NORCO) 10-325 MG tablet, Take 1 tablet by mouth 4 (four) times daily as needed., Disp: , Rfl:    ibuprofen (ADVIL) 200 MG tablet, Take 400 mg by mouth 2 (two) times daily., Disp: , Rfl:    lidocaine (XYLOCAINE) 2 % solution, Patient: Mix 1part 2% viscous lidocaine, 1part H20. Swallow 19m of diluted mixture, 370m before meals and at bedtime, up to QID. (Try 39m46mor first dose due to unclear possible allergy; have family present)., Disp: 200 mL, Rfl: 3   nicotine (NICODERM CQ - DOSED IN MG/24 HOURS) 14 mg/24hr patch, Place 1 patch (14 mg total) onto the skin daily. (Patient not taking: Reported on 08/27/2021), Disp: 28 patch, Rfl: 2   nicotine (NICODERM CQ - DOSED IN MG/24 HOURS) 14 mg/24hr patch, Place 1 patch (14 mg total) onto the skin daily. Apply 21 mg patch daily x 6 wk, then '14mg'$  patch daily x 2 wk, then 7 mg patch daily x 2 wk (Patient not taking: Reported on 08/27/2021), Disp: 14 patch, Rfl: 0   nicotine (NICODERM CQ - DOSED IN MG/24 HOURS) 21 mg/24hr patch, Place 1 patch (21 mg total) onto the skin daily. Apply 21 mg patch daily x 6 wk, then '14mg'$  patch daily x 2 wk, then 7 mg patch daily x 2 wk (Patient not taking: Reported on 08/27/2021), Disp: 14 patch, Rfl: 2   nicotine (NICODERM CQ - DOSED IN MG/24 HR) 7 mg/24hr patch, Place 1 patch (7 mg total) onto the skin daily. Apply 21 mg patch daily x 6 wk, then '14mg'$  patch daily x 2 wk, then 7 mg patch daily x 2 wk (Patient not taking: Reported on 08/27/2021), Disp: 14 patch, Rfl: 0   nicotine polacrilex (NICORETTE) 2 MG gum,  Take 1 each (2 mg total) by mouth as needed for smoking cessation. (Patient not taking: Reported on 08/27/2021), Disp: 100 tablet, Rfl: 2   omeprazole  (PRILOSEC) 40 MG capsule, 40 mg 2 (two) times daily., Disp: , Rfl:    oxyCODONE-acetaminophen (PERCOCET) 7.5-325 MG tablet, Take 1 tablet by mouth every 6 (six) hours as needed for severe pain., Disp: , Rfl:    OXYGEN, Inhale 3.5 L into the lungs daily as needed (Low oxygen)., Disp: , Rfl:    senna-docusate (SENOKOT-S) 8.6-50 MG tablet, Take 2 tablets by mouth at bedtime as needed for mild constipation., Disp: , Rfl:    sucralfate (CARAFATE) 1 g tablet, Dissolve 1 tablet in 10 mL H20 and swallow 30 min prior to meals and bedtime, PRN sore throat, Disp: 40 tablet, Rfl: 3   SYMBICORT 160-4.5 MCG/ACT inhaler, Inhale 2 puffs into the lungs daily as needed (Breathing/COPD/emphysema)., Disp: , Rfl:    Tiotropium Bromide Monohydrate (SPIRIVA RESPIMAT) 2.5 MCG/ACT AERS, Inhale 2 puffs into the lungs daily. (Patient taking differently: Inhale 1 puff into the lungs daily.), Disp: 8 g, Rfl: 0   TURMERIC PO, Take 1 tablet by mouth daily as needed (If feel like getting sick)., Disp: , Rfl:    venlafaxine XR (EFFEXOR-XR) 150 MG 24 hr capsule, Take 150 mg by mouth daily with breakfast., Disp: , Rfl:    zolpidem (AMBIEN) 5 MG tablet, Take 5 mg by mouth at bedtime., Disp: , Rfl:

## 2021-12-04 DIAGNOSIS — I25111 Atherosclerotic heart disease of native coronary artery with angina pectoris with documented spasm: Secondary | ICD-10-CM | POA: Diagnosis not present

## 2021-12-04 DIAGNOSIS — I10 Essential (primary) hypertension: Secondary | ICD-10-CM | POA: Diagnosis not present

## 2021-12-04 DIAGNOSIS — J449 Chronic obstructive pulmonary disease, unspecified: Secondary | ICD-10-CM | POA: Diagnosis not present

## 2021-12-04 DIAGNOSIS — E7800 Pure hypercholesterolemia, unspecified: Secondary | ICD-10-CM | POA: Diagnosis not present

## 2021-12-17 ENCOUNTER — Other Ambulatory Visit: Payer: Self-pay | Admitting: Cardiology

## 2021-12-17 DIAGNOSIS — I10 Essential (primary) hypertension: Secondary | ICD-10-CM

## 2021-12-22 DIAGNOSIS — Z79891 Long term (current) use of opiate analgesic: Secondary | ICD-10-CM | POA: Diagnosis not present

## 2021-12-22 DIAGNOSIS — M542 Cervicalgia: Secondary | ICD-10-CM | POA: Diagnosis not present

## 2021-12-22 DIAGNOSIS — M5442 Lumbago with sciatica, left side: Secondary | ICD-10-CM | POA: Diagnosis not present

## 2021-12-22 DIAGNOSIS — F3342 Major depressive disorder, recurrent, in full remission: Secondary | ICD-10-CM | POA: Diagnosis not present

## 2021-12-22 DIAGNOSIS — N3000 Acute cystitis without hematuria: Secondary | ICD-10-CM | POA: Diagnosis not present

## 2021-12-22 DIAGNOSIS — K5903 Drug induced constipation: Secondary | ICD-10-CM | POA: Diagnosis not present

## 2022-01-07 ENCOUNTER — Ambulatory Visit: Payer: Medicare Other | Admitting: Radiation Oncology

## 2022-01-19 DIAGNOSIS — M545 Low back pain, unspecified: Secondary | ICD-10-CM | POA: Diagnosis not present

## 2022-01-19 DIAGNOSIS — M47816 Spondylosis without myelopathy or radiculopathy, lumbar region: Secondary | ICD-10-CM | POA: Diagnosis not present

## 2022-01-19 DIAGNOSIS — K5903 Drug induced constipation: Secondary | ICD-10-CM | POA: Diagnosis not present

## 2022-01-19 DIAGNOSIS — Z79891 Long term (current) use of opiate analgesic: Secondary | ICD-10-CM | POA: Diagnosis not present

## 2022-01-21 ENCOUNTER — Telehealth: Payer: Self-pay | Admitting: Emergency Medicine

## 2022-01-21 ENCOUNTER — Ambulatory Visit
Admission: RE | Admit: 2022-01-21 | Discharge: 2022-01-21 | Disposition: A | Discharge status: Home / Self Care | Payer: Medicare Other | Source: Ambulatory Visit | Attending: Radiation Oncology | Admitting: Radiation Oncology

## 2022-01-21 DIAGNOSIS — C320 Malignant neoplasm of glottis: Secondary | ICD-10-CM

## 2022-01-21 NOTE — Progress Notes (Signed)
Oncology Nurse Navigator Documentation   I called and spoke with Elizabeth Velazquez with Elizabeth Velazquez in the room with her. I advised that Dr. 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 I also advised her that Dr. Isidore Moos recommends yearly TSH labs with her PCP due to the risk of hypothyroidism secondary to previous radiation to her neck. She voiced her understanding and willingness to complete that yearly lab. I also discussed the importance of continued follow up with Dr. Bethann Humble her ENT physician. They have my direct contact information to call with any future questions or concerns.   Harlow Asa RN, BSN, OCN Head & Neck Oncology Nurse [place] at [facility] Phone # [phone]  Fax # [phone]

## 2022-01-21 NOTE — Progress Notes (Signed)
Rn called Elizabeth Velazquez  to see if she was coming to her follow-up appointment with Dr. Isidore Moos this morning. Patient did not answer her phone . After several tries Rn called the patients daughter. Patients daughterArmy Velazquez) states that she wish to cancel the appointment at this time and that she did not want to reschedule at this time . States that they will call when they are ready to reschedule.

## 2022-02-10 DIAGNOSIS — Z23 Encounter for immunization: Secondary | ICD-10-CM | POA: Diagnosis not present

## 2022-02-10 DIAGNOSIS — R300 Dysuria: Secondary | ICD-10-CM | POA: Diagnosis not present

## 2022-02-16 DIAGNOSIS — M545 Low back pain, unspecified: Secondary | ICD-10-CM | POA: Diagnosis not present

## 2022-02-16 DIAGNOSIS — Z79891 Long term (current) use of opiate analgesic: Secondary | ICD-10-CM | POA: Diagnosis not present

## 2022-02-16 DIAGNOSIS — M47816 Spondylosis without myelopathy or radiculopathy, lumbar region: Secondary | ICD-10-CM | POA: Diagnosis not present

## 2022-02-16 DIAGNOSIS — K5903 Drug induced constipation: Secondary | ICD-10-CM | POA: Diagnosis not present

## 2022-03-02 ENCOUNTER — Other Ambulatory Visit: Payer: Self-pay

## 2022-03-02 DIAGNOSIS — I10 Essential (primary) hypertension: Secondary | ICD-10-CM

## 2022-03-02 MED ORDER — AMLODIPINE BESYLATE 5 MG PO TABS: ORAL_TABLET | ORAL | 1 refills | Status: DC

## 2022-03-12 ENCOUNTER — Ambulatory Visit: Payer: Medicare Other | Admitting: Cardiology

## 2022-03-12 ENCOUNTER — Encounter: Payer: Self-pay | Admitting: Cardiology

## 2022-03-12 VITALS — BP 112/74 | HR 50 | Resp 17 | Ht <= 58 in | Wt 97.0 lb

## 2022-03-12 DIAGNOSIS — R0609 Other forms of dyspnea: Secondary | ICD-10-CM | POA: Diagnosis not present

## 2022-03-12 DIAGNOSIS — E782 Mixed hyperlipidemia: Secondary | ICD-10-CM | POA: Diagnosis not present

## 2022-03-12 DIAGNOSIS — I2510 Atherosclerotic heart disease of native coronary artery without angina pectoris: Secondary | ICD-10-CM | POA: Diagnosis not present

## 2022-03-12 NOTE — Progress Notes (Signed)
Follow up visit  Subjective:   Elizabeth Velazquez, female    DOB: 02/08/1941, 81 y.o.   MRN: [id number]   HPI  Chief Complaint  Patient presents with   Coronary Artery Disease   Follow-up    4 week    81 y.o. 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  Ever since she has had difficulty swallowing.  More recently, she has had worsening exertional dyspnea with minimal activity.  She denies any chest pain.   Current Outpatient Medications:    albuterol (PROVENTIL) (2.5 MG/3ML) 0.083% nebulizer solution, Take 2.5 mg by nebulization every 6 (six) hours as needed for shortness of breath. , Disp: , Rfl:    alendronate (FOSAMAX) 70 MG tablet, Take 70 mg by mouth once a week. Take with a full glass of water on an empty stomach., Disp: , Rfl:    amLODipine (NORVASC) 5 MG tablet, I tablet daily, Disp: 90 tablet, Rfl: 1   aspirin EC 81 MG tablet, Take 1 tablet (81 mg total) by mouth daily., Disp: 90 tablet, Rfl: 3   atorvastatin (LIPITOR) 20 MG tablet, Take 20 mg by mouth daily., Disp: , Rfl:    baclofen (LIORESAL) 10 MG tablet, Take 10 mg by mouth 3 (three) times daily as needed for muscle spasms. , Disp: , Rfl:    buPROPion (WELLBUTRIN SR) 150 MG 12 hr tablet, Start one week before quit date. Take 1 tab daily x 3 days, then 1 tab BID thereafter., Disp: 60 tablet, Rfl: 2   Cholecalciferol (VITAMIN D3) 125 MCG (5000 UT) TABS, Take 5,000 Units by mouth daily., Disp: , Rfl:    docusate sodium (COLACE) 100 MG capsule, Take 100 mg by mouth at bedtime as needed for mild constipation., Disp: , Rfl:    doxycycline (VIBRAMYCIN) 100 MG capsule, Take 1 capsule (100 mg total) by mouth 2 (two) times daily., Disp: 20 capsule, Rfl: 0   gabapentin (NEURONTIN) 300 MG capsule, Take 300 mg by mouth 3 (three) times daily., Disp: , Rfl:     HYDROcodone-acetaminophen (NORCO) 10-325 MG tablet, Take 1 tablet by mouth 4 (four) times daily as needed., Disp: , Rfl:    ibuprofen (ADVIL) 200 MG tablet, Take 400 mg by mouth 2 (two) times daily., Disp: , Rfl:    lidocaine (XYLOCAINE) 2 % solution, Patient: Mix 1part 2% viscous lidocaine, 1part H20. Swallow 74m of diluted mixture, 333m before meals and at bedtime, up to QID. (Try 65m65mor first dose due to unclear possible allergy; have family present)., Disp: 200 mL, Rfl: 3   nicotine (NICODERM CQ - DOSED IN MG/24 HOURS) 14 mg/24hr patch, Place 1 patch (14 mg total) onto the skin daily. (Patient not taking: Reported on 08/27/2021), Disp: 28 patch, Rfl: 2   nicotine (NICODERM CQ - DOSED IN MG/24 HOURS) 14 mg/24hr patch, Place 1 patch (14 mg total) onto the skin daily. Apply 21 mg patch daily x 6 wk, then 2m6mtch daily x 2 wk, then 7 mg patch daily x 2 wk (Patient not taking: Reported on 08/27/2021), Disp: 14 patch, Rfl: 0   nicotine (NICODERM CQ - DOSED IN MG/24 HOURS) 21 mg/24hr patch, Place 1 patch (21 mg total) onto the skin daily. Apply 21 mg patch daily x 6 wk, then 2mg665mch daily x 2 wk, then 7 mg patch daily x 2 wk (  Patient not taking: Reported on 08/27/2021), Disp: 14 patch, Rfl: 2   nicotine (NICODERM CQ - DOSED IN MG/24 HR) 7 mg/24hr patch, Place 1 patch (7 mg total) onto the skin daily. Apply 21 mg patch daily x 6 wk, then 36m patch daily x 2 wk, then 7 mg patch daily x 2 wk (Patient not taking: Reported on 08/27/2021), Disp: 14 patch, Rfl: 0   nicotine polacrilex (NICORETTE) 2 MG gum, Take 1 each (2 mg total) by mouth as needed for smoking cessation. (Patient not taking: Reported on 08/27/2021), Disp: 100 tablet, Rfl: 2   omeprazole (PRILOSEC) 40 MG capsule, 40 mg 2 (two) times daily., Disp: , Rfl:    oxyCODONE-acetaminophen (PERCOCET) 7.5-325 MG tablet, Take 1 tablet by mouth every 6 (six) hours as needed for severe pain., Disp: , Rfl:    OXYGEN, Inhale 3.5 L into the lungs daily as  needed (Low oxygen)., Disp: , Rfl:    senna-docusate (SENOKOT-S) 8.6-50 MG tablet, Take 2 tablets by mouth at bedtime as needed for mild constipation., Disp: , Rfl:    sucralfate (CARAFATE) 1 g tablet, Dissolve 1 tablet in 10 mL H20 and swallow 30 min prior to meals and bedtime, PRN sore throat, Disp: 40 tablet, Rfl: 3   SYMBICORT 160-4.5 MCG/ACT inhaler, Inhale 2 puffs into the lungs daily as needed (Breathing/COPD/emphysema)., Disp: , Rfl:    Tiotropium Bromide Monohydrate (SPIRIVA RESPIMAT) 2.5 MCG/ACT AERS, Inhale 2 puffs into the lungs daily. (Patient taking differently: Inhale 1 puff into the lungs daily.), Disp: 8 g, Rfl: 0   TURMERIC PO, Take 1 tablet by mouth daily as needed (If feel like getting sick)., Disp: , Rfl:    venlafaxine XR (EFFEXOR-XR) 150 MG 24 hr capsule, Take 150 mg by mouth daily with breakfast., Disp: , Rfl:    zolpidem (AMBIEN) 5 MG tablet, Take 5 mg by mouth at bedtime., Disp: , Rfl:     Cardiovascular & other pertient studies:  EKG 03/12/2022: Sinus rhythm 93 bpm with sinus arrhtymia Left atrial enlargement Nonspecific T-abnormality -Nondiagnostic  CT Chest 05/14/2021:  (Reviewed and independently interpreted) 1. New 6.5 mm left upper lobe pulmonary nodule with spiculated and irregular margins, highly suspicious for primary bronchogenic malignancy. This nodule is too small for PET characterization, however is suspicious by CT. Consider multi disciplinary referral for further workup options. 2. All of the additional pulmonary nodules are unchanged from prior exam, largest measuring 4-5 mm, many of which are perifissural or subpleural in location. 3. Moderate emphysema and bronchial thickening. 4. Aortic atherosclerosis. Coronary artery calcifications or stents.  Echocardiogram 04/28/2019:  Left ventricle cavity is normal in size. Mild concentric hypertrophy of  the left ventricle. Normal global wall motion. Normal LV systolic function  with EF 60%. Doppler  evidence of grade I (impaired) diastolic dysfunction,  normal LAP.  Mild (Grade I) mitral regurgitation.  Mild tricuspid regurgitation.  No evidence of pulmonary hypertension.   Recent labs: 09/23/2021: Glucose 121, BUN/Cr 13/0.42. EGFR >60. Na/K 144/3.3. Rest of the CMP normal H/H 13/43. MCV 86. Platelets 235 TSH 0.9 normal  04/16/2021: Glucose 111, BUN/Cr 15/0.66. EGFR 83. Na/K 146/3.8. Rest of the CMP normal H/H 14/45. MCV 85. Platelets 237 HbA1C 5.9% Chol 174, TG 98, HDL 79, LDL 75 TSH 1.0 normal    Review of Systems  HENT:         Change in voice  Cardiovascular:  Positive for dyspnea on exertion. Negative for chest pain, leg swelling, palpitations and syncope.  Musculoskeletal:  Positive for back pain.  Gastrointestinal:  Positive for dysphagia.  Psychiatric/Behavioral:  The patient is nervous/anxious.          Vitals:   03/12/22 1324  BP: 112/74  Pulse: (!) 50  Resp: 17  SpO2: 91%    Body mass index is 20.99 kg/m. Filed Weights   03/12/22 1324  Weight: 97 lb (44 kg)     Objective:   Physical Exam Vitals and nursing note reviewed.  Constitutional:      General: She is not in acute distress.    Comments: Dysphonia  Neck:     Vascular: No JVD.  Cardiovascular:     Rate and Rhythm: Normal rate and regular rhythm.     Heart sounds: Normal heart sounds. No murmur heard. Pulmonary:     Effort: Pulmonary effort is normal.     Breath sounds: Wheezing present. No rales.  Musculoskeletal:     Right lower leg: No edema.     Left lower leg: No edema.       ICD-10-CM   1. Exertional dyspnea  R06.09     2. Coronary artery disease involving native coronary artery of native heart without angina pectoris  I25.10 EKG 12-Lead    3. Mixed hyperlipidemia  E78.2            Assessment & Recommendations:   81 y.o. 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  Current symptoms combined with wheezing on exam are more concerning for COPD exacerbation.   Recommending increasing use of as needed albuterol and scheduling follow-up visit with PCP for management of COPD.  Coronary artery disease: Multiple risk factors for CAD including ongoing tobacco abuse, hypertension, hyperlipidemia, noted severe calcification on CT chest, no stents. Current symptoms of exertional dyspnea with wheezing more concerning for COPD. If symptoms not improved after after management of COPD, will consider stress testing. Continue aspirin, statin.    Irregular heart beat: Sinus arrhythmia on EKG.  No evidence of A-fib.  Increase heart rate on exertion likely related to her poor conditioning and COPD.  If symptoms not improved after optimal management of COPD, will consider cardiac telemetry testing.  F/u in 6 to 8 weeks     Nigel Mormon, MD Pager: [phone] Office: [phone]

## 2022-03-16 DIAGNOSIS — Z79891 Long term (current) use of opiate analgesic: Secondary | ICD-10-CM | POA: Diagnosis not present

## 2022-03-16 DIAGNOSIS — M545 Low back pain, unspecified: Secondary | ICD-10-CM | POA: Diagnosis not present

## 2022-03-16 DIAGNOSIS — K5903 Drug induced constipation: Secondary | ICD-10-CM | POA: Diagnosis not present

## 2022-03-16 DIAGNOSIS — M47816 Spondylosis without myelopathy or radiculopathy, lumbar region: Secondary | ICD-10-CM | POA: Diagnosis not present

## 2022-04-02 DIAGNOSIS — J449 Chronic obstructive pulmonary disease, unspecified: Secondary | ICD-10-CM | POA: Diagnosis not present

## 2022-04-07 ENCOUNTER — Ambulatory Visit: Payer: Medicare Other | Admitting: Pulmonary Disease

## 2022-04-13 DIAGNOSIS — M47816 Spondylosis without myelopathy or radiculopathy, lumbar region: Secondary | ICD-10-CM | POA: Diagnosis not present

## 2022-04-22 ENCOUNTER — Ambulatory Visit: Payer: Medicare Other | Admitting: Cardiology

## 2022-04-24 DIAGNOSIS — R490 Dysphonia: Secondary | ICD-10-CM | POA: Diagnosis not present

## 2022-04-24 DIAGNOSIS — D380 Neoplasm of uncertain behavior of larynx: Secondary | ICD-10-CM | POA: Diagnosis not present

## 2022-04-24 DIAGNOSIS — J381 Polyp of vocal cord and larynx: Secondary | ICD-10-CM | POA: Diagnosis not present

## 2022-04-24 DIAGNOSIS — C320 Malignant neoplasm of glottis: Secondary | ICD-10-CM | POA: Diagnosis not present

## 2022-04-27 ENCOUNTER — Other Ambulatory Visit: Payer: Self-pay | Admitting: Otolaryngology

## 2022-04-27 DIAGNOSIS — R1310 Dysphagia, unspecified: Secondary | ICD-10-CM

## 2022-04-27 DIAGNOSIS — J449 Chronic obstructive pulmonary disease, unspecified: Secondary | ICD-10-CM | POA: Diagnosis not present

## 2022-04-28 ENCOUNTER — Other Ambulatory Visit: Payer: Self-pay | Admitting: Otolaryngology

## 2022-05-11 ENCOUNTER — Other Ambulatory Visit: Payer: Self-pay | Admitting: Otolaryngology

## 2022-05-11 ENCOUNTER — Ambulatory Visit
Admission: RE | Admit: 2022-05-11 | Discharge: 2022-05-11 | Disposition: A | Discharge status: Home / Self Care | Payer: Medicare Other | Source: Ambulatory Visit | Attending: Otolaryngology | Admitting: Otolaryngology

## 2022-05-11 DIAGNOSIS — R1310 Dysphagia, unspecified: Secondary | ICD-10-CM

## 2022-05-11 DIAGNOSIS — K224 Dyskinesia of esophagus: Secondary | ICD-10-CM | POA: Diagnosis not present

## 2022-05-11 DIAGNOSIS — K449 Diaphragmatic hernia without obstruction or gangrene: Secondary | ICD-10-CM | POA: Diagnosis not present

## 2022-05-11 DIAGNOSIS — R1312 Dysphagia, oropharyngeal phase: Secondary | ICD-10-CM | POA: Diagnosis not present

## 2022-05-12 ENCOUNTER — Other Ambulatory Visit: Payer: Medicare Other

## 2022-05-13 DIAGNOSIS — Z79891 Long term (current) use of opiate analgesic: Secondary | ICD-10-CM | POA: Diagnosis not present

## 2022-05-13 DIAGNOSIS — R070 Pain in throat: Secondary | ICD-10-CM | POA: Diagnosis not present

## 2022-05-14 ENCOUNTER — Encounter (HOSPITAL_COMMUNITY): Payer: Self-pay | Admitting: Physician Assistant

## 2022-05-14 ENCOUNTER — Encounter (HOSPITAL_COMMUNITY): Payer: Self-pay | Admitting: Otolaryngology

## 2022-05-14 ENCOUNTER — Other Ambulatory Visit: Payer: Self-pay

## 2022-05-14 NOTE — Anesthesia Preprocedure Evaluation (Deleted)
Anesthesia Evaluation    Airway        Dental   Pulmonary Current Smoker          Cardiovascular hypertension,      Neuro/Psych    GI/Hepatic   Endo/Other    Renal/GU      Musculoskeletal   Abdominal   Peds  Hematology   Anesthesia Other Findings   Reproductive/Obstetrics                              Anesthesia Physical Anesthesia Plan  ASA:   Anesthesia Plan:    Post-op Pain Management:    Induction:   PONV Risk Score and Plan:   Airway Management Planned:   Additional Equipment:   Intra-op Plan:   Post-operative Plan:   Informed Consent:   Plan Discussed with:   Anesthesia Plan Comments: (PAT note by Karoline Caldwell, PA-C: Follows with cardiology for hx of CAD by severe calcifications seen on CT, bilateral carotid disease (50 to [id number] RICA and [id number] LICA by duplex 0000000). Nuclear stress 06/2021 showed normal perfusion.  Echo 06/2021 showed normal LV function EF 67%, mild tricuspid regurgitation.  Last seen by Dr. Virgina Jock 03/12/2022 with complaint of worsening dyspnea.  Per note, "Exertional dyspnea: Although she has known coronary artery disease noted with severe calcification on CT chest.  Current symptoms combined with wheezing on exam are more concerning for COPD exacerbation. Recommending increasing use of as needed albuterol and scheduling follow-up visit with PCP for management of COPD."  Current 1 to 2 pack/day smoker with associated COPD followed by pulmonologist Dr. Erin Fulling.  She is maintained on Symbicort and Spiriva.  She is on as needed oxygen at home but reportedly has poor compliance with this.  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  She was seen in follow-up by otolaryngologist Dr. Fredric Dine on 04/24/2022 with complaint of progressive dysphagia.  In office laryngoscopy  showed leukoplakic changes along the left true vocal cord, and to a lesser extent on the right true vocal cord with normal vocal cord mobility.  These findings are suspicious for recurrent neoplasm and was recommended to proceed with microlaryngoscopy and biopsy in the immediate future.  Will need DOS labs and evaluation.  EKG 03/12/2022: Sinus rhythm 93 bpm with sinus arrhythmia. Left atrial enlargement. Nonspecific T-abnormality -Nondiagnostic  Lexiscan  Nuclear stress test 06/23/2021: Nondiagnostic ECG stress. The heart rate response was consistent with Lexiscan.  Myocardial perfusion is normal. Overall LV systolic function is normal without regional wall motion abnormalities. Calculated Stress LV EF: 44%, visually normal.  No previous exam available for comparison. Low risk.   Echocardiogram 06/19/2021: Left ventricle cavity is normal in size and wall thickness. Normal global wall motion. Normal LV systolic function with EF 67%. Normal diastolic filling pattern. Mild tricuspid regurgitation. No evidence of pulmonary hypertension. Mild MR noted in 04/2020 not appreciated on this study. No other change noted.  Carotid artery duplex 06/19/2021:  Duplex suggests stenosis in the right internal carotid artery (50-69%).  Duplex suggests stenosis in the right external carotid artery (>50%).  Duplex suggests stenosis in the left carotid artery bulb (<50%).  Antegrade right vertebral artery flow. Antegrade left vertebral artery  flow.  Follow up in six months is appropriate if clinically indicated.   )         Anesthesia Quick Evaluation

## 2022-05-14 NOTE — Progress Notes (Signed)
I spoke briefly with Elizabeth Velazquez, she asked me to speak to Elizabeth Velazquez- patient's daughter- in -law, who is an approved person to speak with.  Elizabeth Velazquez strains when speaking.  Elizabeth Velazquez states that Elizabeth Velazquez has not complained of chest pain, patient is short of breathe frequently, patient has O2 3.5 liters to use prn. Elizabeth Velazquez does not use O2 often, patient smokes 2 packs of cigarettes a day. Elizabeth Velazquez reports that no one in the home has s/s of Covid and no one has been expose to any with Covid to their knowledge.  Elizabeth Velazquez's PCP is Elizabeth Reader, Elizabeth Velazquez.  Cardiologist is Elizabeth Velazquez, patient was seen 03/12/22.  Pulmonologist is Elizabeth Velazquez, patient was seen by Elizabeth Velazquez 05/19/21- was to follow in 6 months, Elizabeth Velazquez has not at this time. Elizabeth Velazquez reports that patient is taking care of 1 thing at at time, "she is very concerned about vocal cords," Elizabeth Velazquez said.  I asked Elizabeth Velazquez. PA- C to review.

## 2022-05-14 NOTE — Progress Notes (Signed)
Anesthesia Chart Review: Same day workup  Follows with cardiology for hx of CAD by severe calcifications seen on CT, bilateral carotid disease (50 to 08% RICA and <65% LICA by duplex 10/8467). Nuclear stress 06/2021 showed normal perfusion.  Echo 06/2021 showed normal LV function EF 67%, mild tricuspid regurgitation.  Last seen by Dr. Virgina Jock 03/12/2022 with complaint of worsening dyspnea.  Per note, "Exertional dyspnea: Although she has known coronary artery disease noted with severe calcification on CT chest.  Current symptoms combined with wheezing on exam are more concerning for COPD exacerbation. Recommending increasing use of as needed albuterol and scheduling follow-up visit with PCP for management of COPD."  Current 1 to 2 pack/day smoker with associated COPD followed by pulmonologist Dr. Erin Fulling.  She is maintained on Symbicort and Spiriva.  She is on as needed oxygen at home but reportedly has poor compliance with this.  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  She was seen in follow-up by otolaryngologist Dr. Fredric Dine on 04/24/2022 with complaint of progressive dysphagia.  In office laryngoscopy showed leukoplakic changes along the left true vocal cord, and to a lesser extent on the right true vocal cord with normal vocal cord mobility.  These findings are suspicious for recurrent neoplasm and was recommended to proceed with microlaryngoscopy and biopsy in the immediate future.  Will need DOS labs and evaluation.  EKG 03/12/2022: Sinus rhythm 93 bpm with sinus arrhythmia. Left atrial enlargement. Nonspecific T-abnormality -Nondiagnostic  Lexiscan  Nuclear stress test 06/23/2021: Nondiagnostic ECG stress. The heart rate response was consistent with Lexiscan.  Myocardial perfusion is normal. Overall LV systolic function is normal without regional wall motion abnormalities. Calculated Stress  LV EF: 44%, visually normal.  No previous exam available for comparison. Low risk.   Echocardiogram 06/19/2021: Left ventricle cavity is normal in size and wall thickness. Normal global wall motion. Normal LV systolic function with EF 67%. Normal diastolic filling pattern. Mild tricuspid regurgitation. No evidence of pulmonary hypertension. Mild MR noted in 04/2020 not appreciated on this study. No other change noted.  Carotid artery duplex 06/19/2021:  Duplex suggests stenosis in the right internal carotid artery (50-69%).  Duplex suggests stenosis in the right external carotid artery (>50%).  Duplex suggests stenosis in the left carotid artery bulb (<50%).  Antegrade right vertebral artery flow. Antegrade left vertebral artery  flow.  Follow up in six months is appropriate if clinically indicated.     Wynonia Musty [facility] Short Stay Center/Anesthesiology Phone [phone] 05/14/2022 12:54 PM

## 2022-05-14 NOTE — Progress Notes (Addendum)
I received a call from [place], Elizabeth Alfredo daughter-in-law, who said that Elizabeth Velazquez said she needs to cancel due to being constipated, patient took laxative last night, it has not worked, she has to take more tonight and will not be in any condition to have surgery tomorrow. I notified Dr. Redmond Baseman, he will notify Dr.Skotnikki, I notifed the OR per Dr. Redmond Baseman, I asked them to cancel Elizabeth Jiles's case and move any others up.

## 2022-05-15 ENCOUNTER — Ambulatory Visit (HOSPITAL_COMMUNITY): Admission: RE | Admit: 2022-05-15 | Payer: Medicare Other | Source: Ambulatory Visit | Admitting: Otolaryngology

## 2022-05-15 HISTORY — DX: Unspecified asthma, uncomplicated: J45.909

## 2022-05-15 HISTORY — DX: Fibromyalgia: M79.7

## 2022-05-15 SURGERY — MICROLARYNGOSCOPY
Anesthesia: General | Laterality: Bilateral

## 2022-05-19 ENCOUNTER — Other Ambulatory Visit: Payer: Self-pay | Admitting: Otolaryngology

## 2022-05-21 NOTE — Progress Notes (Signed)
Elizabeth Velazquez reports no s/s of Covid.  I spoke with [name], patient told me she took Miralax this am and it has not worked. I instructed Elizabeth Velazquez that if there is going to be another issue with the laxative, she or [name] should call the ENT office before 5:00 PM.

## 2022-05-22 ENCOUNTER — Ambulatory Visit (HOSPITAL_BASED_OUTPATIENT_CLINIC_OR_DEPARTMENT_OTHER): Payer: Medicare Other | Admitting: Anesthesiology

## 2022-05-22 ENCOUNTER — Encounter (HOSPITAL_COMMUNITY): Payer: Self-pay | Admitting: Otolaryngology

## 2022-05-22 ENCOUNTER — Telehealth: Payer: Self-pay | Admitting: Radiation Oncology

## 2022-05-22 ENCOUNTER — Ambulatory Visit (HOSPITAL_COMMUNITY)
Admission: RE | Admit: 2022-05-22 | Discharge: 2022-05-22 | Disposition: A | Discharge status: Home / Self Care | Payer: Medicare Other | Attending: Otolaryngology | Admitting: Otolaryngology

## 2022-05-22 ENCOUNTER — Ambulatory Visit (HOSPITAL_COMMUNITY): Payer: Medicare Other | Admitting: Anesthesiology

## 2022-05-22 ENCOUNTER — Encounter (HOSPITAL_COMMUNITY)
Admission: RE | Disposition: A | Discharge status: Home / Self Care | Payer: Self-pay | Source: Home / Self Care | Attending: Otolaryngology

## 2022-05-22 ENCOUNTER — Other Ambulatory Visit: Payer: Self-pay

## 2022-05-22 DIAGNOSIS — J384 Edema of larynx: Secondary | ICD-10-CM | POA: Insufficient documentation

## 2022-05-22 DIAGNOSIS — F419 Anxiety disorder, unspecified: Secondary | ICD-10-CM | POA: Diagnosis not present

## 2022-05-22 DIAGNOSIS — J449 Chronic obstructive pulmonary disease, unspecified: Secondary | ICD-10-CM | POA: Diagnosis not present

## 2022-05-22 DIAGNOSIS — F17210 Nicotine dependence, cigarettes, uncomplicated: Secondary | ICD-10-CM | POA: Diagnosis not present

## 2022-05-22 DIAGNOSIS — M797 Fibromyalgia: Secondary | ICD-10-CM

## 2022-05-22 DIAGNOSIS — J383 Other diseases of vocal cords: Secondary | ICD-10-CM | POA: Insufficient documentation

## 2022-05-22 DIAGNOSIS — I2510 Atherosclerotic heart disease of native coronary artery without angina pectoris: Secondary | ICD-10-CM | POA: Insufficient documentation

## 2022-05-22 DIAGNOSIS — D380 Neoplasm of uncertain behavior of larynx: Secondary | ICD-10-CM | POA: Diagnosis not present

## 2022-05-22 DIAGNOSIS — J387 Other diseases of larynx: Secondary | ICD-10-CM | POA: Insufficient documentation

## 2022-05-22 DIAGNOSIS — Z9981 Dependence on supplemental oxygen: Secondary | ICD-10-CM | POA: Insufficient documentation

## 2022-05-22 DIAGNOSIS — R1310 Dysphagia, unspecified: Secondary | ICD-10-CM | POA: Insufficient documentation

## 2022-05-22 DIAGNOSIS — C320 Malignant neoplasm of glottis: Secondary | ICD-10-CM | POA: Diagnosis not present

## 2022-05-22 DIAGNOSIS — F32A Depression, unspecified: Secondary | ICD-10-CM | POA: Diagnosis not present

## 2022-05-22 DIAGNOSIS — I10 Essential (primary) hypertension: Secondary | ICD-10-CM | POA: Diagnosis not present

## 2022-05-22 DIAGNOSIS — Z1152 Encounter for screening for COVID-19: Secondary | ICD-10-CM | POA: Diagnosis not present

## 2022-05-22 DIAGNOSIS — Z923 Personal history of irradiation: Secondary | ICD-10-CM | POA: Diagnosis not present

## 2022-05-22 DIAGNOSIS — M1990 Unspecified osteoarthritis, unspecified site: Secondary | ICD-10-CM | POA: Diagnosis not present

## 2022-05-22 DIAGNOSIS — J381 Polyp of vocal cord and larynx: Secondary | ICD-10-CM | POA: Diagnosis not present

## 2022-05-22 DIAGNOSIS — Z79899 Other long term (current) drug therapy: Secondary | ICD-10-CM | POA: Insufficient documentation

## 2022-05-22 DIAGNOSIS — R490 Dysphonia: Secondary | ICD-10-CM | POA: Insufficient documentation

## 2022-05-22 HISTORY — PX: MICROLARYNGOSCOPY: SHX5208

## 2022-05-22 LAB — BASIC METABOLIC PANEL
Anion gap: 14 (ref 5–15)
BUN: 13 mg/dL (ref 8–23)
CO2: 29 mmol/L (ref 22–32)
Calcium: 8.8 mg/dL — ABNORMAL LOW (ref 8.9–10.3)
Chloride: 99 mmol/L (ref 98–111)
Creatinine, Ser: 0.54 mg/dL (ref 0.44–1.00)
GFR, Estimated: >60 mL/min (ref >60)
Glucose, Bld: 119 mg/dL — ABNORMAL HIGH (ref 70–99)
Potassium: 3.1 mmol/L — ABNORMAL LOW (ref 3.5–5.1)
Sodium: 142 mmol/L (ref 135–145)

## 2022-05-22 LAB — SARS CORONAVIRUS 2 BY RT PCR: SARS Coronavirus 2 by RT PCR: NEGATIVE

## 2022-05-22 LAB — CBC
HCT: 44.4 % (ref 36.0–46.0)
Hemoglobin: 14.8 g/dL (ref 12.0–15.0)
MCH: 28.9 pg (ref 26.0–34.0)
MCHC: 33.3 g/dL (ref 30.0–36.0)
MCV: 86.7 fL (ref 80.0–100.0)
Platelets: 207 10*3/uL (ref 150–400)
RBC: 5.12 MIL/uL — ABNORMAL HIGH (ref 3.87–5.11)
RDW: 13.6 % (ref 11.5–15.5)
WBC: 5.5 10*3/uL (ref 4.0–10.5)
nRBC: 0 % (ref 0.0–0.2)

## 2022-05-22 SURGERY — MICROLARYNGOSCOPY
Anesthesia: General | Site: Throat | Laterality: Bilateral

## 2022-05-22 MED ORDER — ONDANSETRON HCL 4 MG/2ML IJ SOLN: 4.0000 mg | Freq: Once | INTRAMUSCULAR | Status: DC | PRN

## 2022-05-22 MED ORDER — ONDANSETRON HCL 4 MG/2ML IJ SOLN
INTRAMUSCULAR | Status: DC | PRN
  Administered 2022-05-22: 4 mg via INTRAVENOUS

## 2022-05-22 MED ORDER — PROPOFOL 10 MG/ML IV BOLUS
INTRAVENOUS | Status: AC
  Filled 2022-05-22: qty 20

## 2022-05-22 MED ORDER — EPINEPHRINE PF 1 MG/ML IJ SOLN
INTRAMUSCULAR | Status: DC | PRN
  Administered 2022-05-22: 1 mg

## 2022-05-22 MED ORDER — FENTANYL CITRATE (PF) 250 MCG/5ML IJ SOLN
INTRAMUSCULAR | Status: DC | PRN
  Administered 2022-05-22: 50 ug via INTRAVENOUS
  Administered 2022-05-22: 100 ug via INTRAVENOUS

## 2022-05-22 MED ORDER — SUGAMMADEX SODIUM 200 MG/2ML IV SOLN
INTRAVENOUS | Status: DC | PRN
  Administered 2022-05-22: 150 mg via INTRAVENOUS

## 2022-05-22 MED ORDER — DEXAMETHASONE SODIUM PHOSPHATE 10 MG/ML IJ SOLN
INTRAMUSCULAR | Status: DC | PRN
  Administered 2022-05-22: 5 mg via INTRAVENOUS

## 2022-05-22 MED ORDER — FENTANYL CITRATE (PF) 250 MCG/5ML IJ SOLN
INTRAMUSCULAR | Status: AC
  Filled 2022-05-22: qty 5

## 2022-05-22 MED ORDER — LIDOCAINE 2% (20 MG/ML) 5 ML SYRINGE
INTRAMUSCULAR | Status: DC | PRN
  Administered 2022-05-22: 40 mg via INTRAVENOUS

## 2022-05-22 MED ORDER — ROCURONIUM BROMIDE 10 MG/ML (PF) SYRINGE
PREFILLED_SYRINGE | INTRAVENOUS | Status: DC | PRN
  Administered 2022-05-22: 30 mg via INTRAVENOUS

## 2022-05-22 MED ORDER — VANCOMYCIN HCL IN DEXTROSE 1-5 GM/200ML-% IV SOLN
1000.0000 mg | INTRAVENOUS | Status: AC
  Administered 2022-05-22: 1000 mg via INTRAVENOUS
  Filled 2022-05-22: qty 200

## 2022-05-22 MED ORDER — ACETAMINOPHEN 10 MG/ML IV SOLN: 650.0000 mg | Freq: Once | INTRAVENOUS | Status: DC | PRN

## 2022-05-22 MED ORDER — LACTATED RINGERS IV SOLN: INTRAVENOUS | Status: DC

## 2022-05-22 MED ORDER — AMISULPRIDE (ANTIEMETIC) 5 MG/2ML IV SOLN: 10.0000 mg | Freq: Once | INTRAVENOUS | Status: DC | PRN

## 2022-05-22 MED ORDER — FENTANYL CITRATE (PF) 100 MCG/2ML IJ SOLN
INTRAMUSCULAR | Status: AC
  Filled 2022-05-22: qty 2

## 2022-05-22 MED ORDER — CHLORHEXIDINE GLUCONATE 0.12 % MT SOLN
15.0000 mL | Freq: Once | OROMUCOSAL | Status: AC
  Administered 2022-05-22: 15 mL via OROMUCOSAL
  Filled 2022-05-22: qty 15

## 2022-05-22 MED ORDER — FENTANYL CITRATE (PF) 100 MCG/2ML IJ SOLN
25.0000 ug | INTRAMUSCULAR | Status: DC | PRN
  Administered 2022-05-22: 25 ug via INTRAVENOUS

## 2022-05-22 MED ORDER — PROPOFOL 10 MG/ML IV BOLUS
INTRAVENOUS | Status: DC | PRN
  Administered 2022-05-22: 100 mg via INTRAVENOUS

## 2022-05-22 MED ORDER — ORAL CARE MOUTH RINSE: 15.0000 mL | Freq: Once | OROMUCOSAL | Status: AC

## 2022-05-22 MED ORDER — EPINEPHRINE HCL (NASAL) 0.1 % NA SOLN
NASAL | Status: AC
  Filled 2022-05-22: qty 30

## 2022-05-22 MED ORDER — 0.9 % SODIUM CHLORIDE (POUR BTL) OPTIME
TOPICAL | Status: DC | PRN
  Administered 2022-05-22: 1000 mL

## 2022-05-22 SURGICAL SUPPLY — 33 items
BAG COUNTER SPONGE SURGICOUNT (BAG) ×2 IMPLANT
BAG SPNG CNTER NS LX DISP (BAG) ×1
BALLN PULM 12 13.5 15X75 (BALLOONS)
BALLN PULMONARY 10-12 (MISCELLANEOUS) IMPLANT
BALLOON PULM 12 13.5 15X75 (BALLOONS) IMPLANT
BLADE SURG 15 STRL LF DISP TIS (BLADE) IMPLANT
BLADE SURG 15 STRL SS (BLADE)
BNDG EYE OVAL (GAUZE/BANDAGES/DRESSINGS) ×4 IMPLANT
CANISTER SUCT 3000ML PPV (MISCELLANEOUS) ×2 IMPLANT
CNTNR URN SCR LID CUP LEK RST (MISCELLANEOUS) IMPLANT
CONT SPEC 4OZ STRL OR WHT (MISCELLANEOUS)
COVER BACK TABLE 60X90IN (DRAPES) ×2 IMPLANT
COVER MAYO STAND STRL (DRAPES) ×2 IMPLANT
DRAPE HALF SHEET 40X57 (DRAPES) ×2 IMPLANT
GAUZE SPONGE 4X4 12PLY STRL (GAUZE/BANDAGES/DRESSINGS) ×2 IMPLANT
GLOVE SURG ENC MOIS LTX SZ6.5 (GLOVE) ×2 IMPLANT
GOWN STRL REUS W/ TWL LRG LVL3 (GOWN DISPOSABLE) IMPLANT
GOWN STRL REUS W/TWL LRG LVL3 (GOWN DISPOSABLE)
KIT BASIN OR (CUSTOM PROCEDURE TRAY) ×2 IMPLANT
KIT TURNOVER KIT B (KITS) ×2 IMPLANT
NDL HYPO 25GX1X1/2 BEV (NEEDLE) IMPLANT
NEEDLE HYPO 25GX1X1/2 BEV (NEEDLE) IMPLANT
NS IRRIG 1000ML POUR BTL (IV SOLUTION) ×2 IMPLANT
PAD ARMBOARD 7.5X6 YLW CONV (MISCELLANEOUS) ×4 IMPLANT
PATTIES SURGICAL .5 X1 (DISPOSABLE) ×2 IMPLANT
PATTIES SURGICAL .5 X3 (DISPOSABLE) ×2 IMPLANT
POSITIONER HEAD DONUT 9IN (MISCELLANEOUS) IMPLANT
SOL ANTI FOG 6CC (MISCELLANEOUS) ×2 IMPLANT
SURGILUBE 2OZ TUBE FLIPTOP (MISCELLANEOUS) IMPLANT
SUT SILK 2 0 PERMA HAND 18 BK (SUTURE) IMPLANT
TOWEL GREEN STERILE FF (TOWEL DISPOSABLE) ×2 IMPLANT
TUBE CONNECTING 12X1/4 (SUCTIONS) ×2 IMPLANT
WATER STERILE IRR 1000ML POUR (IV SOLUTION) ×2 IMPLANT

## 2022-05-22 NOTE — H&P (Signed)
Elizabeth Velazquez is an 82 y.o. female.    Chief Complaint:  Dysphagia and throat pain  HPI: Patient presents today for planned elective procedure.  She denies any interval change in history since office visit on 04/24/2022:  Elizabeth Velazquez is a 82 y.o. 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 She was last in our office on 10/20/2021. She was scheduled to see radiation oncology on 01/21/2022, but did not show up to her appointment. Reports that for the last several months, she has had difficulty with progressive dysphagia. She states that she is able to eat and drink all consistencies, but that it is more difficult to swallow, and takes a longer time to get food down. She has lost about 4 pounds since last visit in July. She also reports more discomfort on the left, and states that she preferentially chews and swallows her food on the right. She continues to smoke on a daily basis. She was seen by cardiology in early December due to worsening exertional dyspnea, and was advised to follow-up with PCP for management of COPD.   Past Medical History:  Diagnosis Date   Anxiety    Arthritis    Asthma    COPD (chronic obstructive pulmonary disease) ([place])    Coronary artery disease    Depression    Dysphagia    Dyspnea    Fibromyalgia    GERD (gastroesophageal reflux disease)    History of hiatal hernia    Hyperlipidemia    Hypertension    Pneumonia    Shoulder fracture, left    Skin cancer    Wears dentures    Wears glasses     Past Surgical History:  Procedure Laterality Date   ABDOMINAL HYSTERECTOMY     BALLOON DILATION N/A 12/16/2018   Procedure: BALLOON DILATION;  Surgeon: Clarene Essex, MD;  Location: [facility];  Service: Endoscopy;  Laterality: N/A;   COLONOSCOPY      ESOPHAGOGASTRODUODENOSCOPY (EGD) WITH PROPOFOL N/A 12/16/2018   Procedure: ESOPHAGOGASTRODUODENOSCOPY (EGD) WITH PROPOFOL, POSSIBLE BALLOON DILATION;  Surgeon: Clarene Essex, MD;  Location: [facility];  Service: Endoscopy;  Laterality: N/A;   HEMORRHOID SURGERY     MICROLARYNGOSCOPY N/A 06/04/2021   Procedure: MICROLARYNGOSCOPY;  Surgeon: Jason Coop, DO;  Location: [facility];  Service: ENT;  Laterality: N/A;   UPPER GI ENDOSCOPY      Family History  Problem Relation Age of Onset   Stroke Mother    Alcohol abuse Father    Heart attack Father    Heart disease Father    Stomach cancer Paternal Grandmother     Social History:  reports that she has been smoking cigarettes. She has a 130.00 pack-year smoking history. She has never used smokeless tobacco. She reports that she does not drink alcohol and does not use drugs.  Allergies:  Allergies  Allergen Reactions   Bee Venom Anaphylaxis   Azithromycin     heart races   Gabapentin     In high doses causes dizziness   Lidocaine     Unknown   Penicillins     Did it involve swelling of the face/tongue/throat, SOB, or low BP? Unknown Did it involve sudden or severe rash/hives, skin peeling, or any reaction on the inside of your mouth or nose? Unknown Did you need  to seek medical attention at a hospital or doctor's office? Unknown When did it last happen?      childhood If all above answers are "NO", may proceed with cephalosporin use.    Serotonin Reuptake Inhibitors (Ssris)     thoughts about hurting herself    Medications Prior to Admission  Medication Sig Dispense Refill   albuterol (PROVENTIL) (2.5 MG/3ML) 0.083% nebulizer solution Take 2.5 mg by nebulization every 4 (four) hours as needed for shortness of breath.     alendronate (FOSAMAX) 70 MG tablet Take 70 mg by mouth once a week. Take with a full glass of water on an empty stomach.     amLODipine (NORVASC) 5 MG tablet I tablet daily 90 tablet 1   atorvastatin (LIPITOR) 20  MG tablet Take 20 mg by mouth daily.     Cholecalciferol (VITAMIN D3) 125 MCG (5000 UT) TABS Take 5,000 Units by mouth daily.     cyclobenzaprine (FLEXERIL) 10 MG tablet Take 10 mg by mouth 2 (two) times daily as needed for muscle spasms.     gabapentin (NEURONTIN) 300 MG capsule Take 300 mg by mouth 2 (two) times daily.     HYDROcodone-acetaminophen (NORCO) 10-325 MG tablet Take 1 tablet by mouth 4 (four) times daily as needed.     ibuprofen (ADVIL) 200 MG tablet Take 400 mg by mouth every 8 (eight) hours as needed for moderate pain.     Menthol-Methyl Salicylate (SALONPAS PAIN RELIEF PATCH EX) Apply 1-2 patches topically daily as needed (pain).     SYMBICORT 160-4.5 MCG/ACT inhaler Inhale 2 puffs into the lungs 2 (two) times daily.     Venlafaxine HCl 225 MG TB24 Take 225 mg by mouth daily with breakfast.     zolpidem (AMBIEN) 5 MG tablet Take 5 mg by mouth at bedtime.     aspirin EC 81 MG tablet Take 1 tablet (81 mg total) by mouth daily. 90 tablet 3   docusate sodium (COLACE) 100 MG capsule Take 100 mg by mouth at bedtime as needed for mild constipation.     OXYGEN Inhale 3.5 L into the lungs daily as needed (Low oxygen).     senna (SENOKOT) 8.6 MG TABS tablet Take 1 tablet by mouth daily as needed for mild constipation.      Results for orders placed or performed during the hospital encounter of 05/22/22 (from the past 48 hour(s))  SARS Coronavirus 2 by RT PCR (hospital order, performed in [facility] hospital lab) *cepheid single result test* Anterior Nasal Swab     Status: None   Collection Time: 05/22/22  6:30 AM   Specimen: Anterior Nasal Swab  Result Value Ref Range   SARS Coronavirus 2 by RT PCR NEGATIVE NEGATIVE    Comment: Performed at [facility], [address] [place], [place] [address]  CBC per protocol     Status: Abnormal   Collection Time: 05/22/22  7:12 AM  Result Value Ref Range   WBC 5.5 4.0 - 10.5 K/uL   RBC 5.12 (H) 3.87 - 5.11 MIL/uL   Hemoglobin 14.8  12.0 - 15.0 g/dL   HCT 44.4 36.0 - 46.0 %   MCV 86.7 80.0 - 100.0 fL   MCH 28.9 26.0 - 34.0 pg   MCHC 33.3 30.0 - 36.0 g/dL   RDW 13.6 11.5 - 15.5 %   Platelets 207 150 - 400 K/uL   nRBC 0.0 0.0 - 0.2 %    Comment: Performed at [facility], [place] [address], [place], [place] [address]   No results found.  ROS: ROS  Blood pressure (!) 148/81, pulse 98, temperature 98.6 F (37 C), temperature source Oral, resp. rate 20, height 4' 9"$  (1.448 m), weight 41.7 kg, SpO2 94 %.  PHYSICAL EXAM: Physical Exam HENT:     Head: Normocephalic.     Right Ear: External ear normal.     Left Ear: External ear normal.     Mouth/Throat:     Mouth: Mucous membranes are moist.  Pulmonary:     Effort: Pulmonary effort is normal.  Neurological:     General: No focal deficit present.     Mental Status: She is alert.  Psychiatric:        Mood and Affect: Mood normal.     Studies Reviewed: None   Assessment/Plan Elizabeth Velazquez is a 82 y.o. 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 She reports that over the last several months, she has suffered from worsening dysphagia as well as left-sided throat pain. Nasolaryngoscopy performed last visit demonstrated leukoplakic changes along the margin of the left greater than right true vocal fold, with preserved vocal fold mobility. Findings are consistent for recurrent neoplasm. -To OR today for microlaryngoscopy and biopsy. Risks of surgery, benefits as well as postoperative course and recovery were reviewed with patient. I will also order a swallow study for evaluation due to patient's reports of dysphagia. I encouraged tobacco cessation     Dayle Sherpa A Chevon Laufer 05/22/2022, 7:34 AM

## 2022-05-22 NOTE — Anesthesia Preprocedure Evaluation (Addendum)
Anesthesia Evaluation  Patient identified by MRN, date of birth, ID band Patient awake    Reviewed: Allergy & Precautions, NPO status , Patient's Chart, lab work & pertinent test results  Airway Mallampati: III  TM Distance: >3 FB Neck ROM: Full    Dental  (+) Edentulous Upper, Edentulous Lower   Pulmonary shortness of breath, asthma , COPD,  COPD inhaler and oxygen dependent, Current SmokerPatient did not abstain from smoking.   Pulmonary exam normal breath sounds clear to auscultation       Cardiovascular hypertension, Pt. on medications + CAD  Normal cardiovascular exam Rhythm:Regular Rate:Normal     Neuro/Psych  PSYCHIATRIC DISORDERS Anxiety Depression     Neuromuscular disease    GI/Hepatic Neg liver ROS, hiatal hernia,,,  Endo/Other  negative endocrine ROS    Renal/GU negative Renal ROS     Musculoskeletal  (+) Arthritis ,  Fibromyalgia -  Abdominal   Peds  Hematology negative hematology ROS (+)   Anesthesia Other Findings Squamous cell carcinoma of glottis  Reinke's edema of vocal folds  Dysphonia  Neoplasm of uncertain behavior of larynx  Dysphagia, unspecified type    Reproductive/Obstetrics                             Anesthesia Physical Anesthesia Plan  ASA: 4  Anesthesia Plan: General   Post-op Pain Management:    Induction: Intravenous  PONV Risk Score and Plan: 2 and Ondansetron, Dexamethasone and Treatment may vary due to age or medical condition  Airway Management Planned: Oral ETT and Video Laryngoscope Planned  Additional Equipment:   Intra-op Plan:   Post-operative Plan: Extubation in OR  Informed Consent: I have reviewed the patients History and Physical, chart, labs and discussed the procedure including the risks, benefits and alternatives for the proposed anesthesia with the patient or authorized representative who has indicated his/her understanding  and acceptance.       Plan Discussed with: CRNA  Anesthesia Plan Comments:        Anesthesia Quick Evaluation

## 2022-05-22 NOTE — Therapy (Signed)
[facility] [address], [place], [place], [address] Phone: [phone]   Fax:  [phone]  Patient Details  Name: Elizabeth Velazquez MRN: [id number] Date of Birth: [birth year] Referring Provider:  Eppie Gibson, MD  Encounter Date: 05/22/2022  SPEECH THERAPY DISCHARGE SUMMARY  Visits from Start of Care: 1 (eval only)  Current functional level related to goals / functional outcomes: Pt was evaluated and then did not return, per her choice. Goals and impression are below from April 2023.:  SLP Short Term Goals - 07/10/21         SLP SHORT TERM GOAL #1      Title Time pt will complete HEP with rare min A   1    Period --   sessions, for all STGs    Status New           SLP SHORT TERM GOAL #2    Title   Time Period pt will tell SLP why pt is completing HEP with modified independence x2 visits 2       Status New           SLP SHORT TERM GOAL #3    Title   Time Period pt will describe 3 overt s/s aspiration PNA with modified independence  2    Status New           SLP SHORT TERM GOAL #4    Title   Time Period pt will tell SLP how a food journal could hasten return to a more normalized diet  2    Status New                    SLP Long Term Goals - 07/10/21                      SLP LONG TERM GOAL #1    Title pt will complete HEP with modified independence over three visits     Time 4    Period --   sessions, for all LTGs    Status New           SLP LONG TERM GOAL #2    Title pt will describe how to modify HEP over time, and the timeline associated with reduction in HEP frequency with modified independence over two sessions     Time Period 6      Status New                    Plan - 07/10/21      Clinical Impression Statement As above, pt swallowing is deemed WFL/WNL with items assessed today; pt endorses eating regular diet items without any overt s/sx or oral or pharyngeal  difficulties reported - caregiver agrees. SLP developed an individualized HEP for dysphagia and pt completed each exercise on his own initially with rare min assist from SLP faded to pt independence. There are no overt s/s aspiration with POs reported by pt at this time, nor overt s/sx aspiration PNA; Neither oral nor pharyngeal difficulties were detected today when SLP observed pt with POs. Data indicate that pt's swallow ability will likely decrease over the course of radiation therapy and could very well decline over time following conclusion of their radiation therapy due to muscle fibrosis and/or atrophy. Pt will cont to need to be seen by SLP in order to assess safety of PO intake, assess the need for recommending  any objective swallow assessment, and ensuring pt correctly completes the individualized HEP.        Remaining deficits: Assumed deficits remain.   Education / Equipment: Late effects of radiation on swallowing ability,  HEP procedure.  Patient agrees to discharge. Patient goals were not met. Patient is being discharged due to the patient's request..    [place], Oto 05/22/2022, 11:44 AM  [facility] [address], [place], [place], [address] Phone: [phone]   Fax:  [phone]

## 2022-05-22 NOTE — Anesthesia Procedure Notes (Signed)
Procedure Name: Intubation Date/Time: 05/22/2022 9:29 AM  Performed by: Bryson Corona, CRNAPre-anesthesia Checklist: Patient identified, Emergency Drugs available, Suction available and Patient being monitored Patient Re-evaluated:Patient Re-evaluated prior to induction Oxygen Delivery Method: Circle System Utilized Preoxygenation: Pre-oxygenation with 100% oxygen Induction Type: IV induction Ventilation: Mask ventilation without difficulty Laryngoscope Size: Glidescope and 3 Grade View: Grade I Tube type: Oral Tube size: 6.0 mm Number of attempts: 1 Airway Equipment and Method: Stylet and Oral airway Placement Confirmation: ETT inserted through vocal cords under direct vision, positive ETCO2 and breath sounds checked- equal and bilateral Secured at: 20 cm Tube secured with: Tape Dental Injury: Teeth and Oropharynx as per pre-operative assessment  Comments: Elective glidescope due to precancerous lesion near vocal cords.

## 2022-05-22 NOTE — Discharge Instructions (Signed)
[facility] POST OP INSTRUCTIONS: LARYNGOSCOPY  The Surgery Itself Laryngoscopy with biopsy or injection involves a brief general anesthesia, typically for  less than one hour. Patients may be sedated for several hours after surgery and may  remain sleepy for the better part of the day. Nausea and vomiting are occasionally seen,  and usually resolve by the evening of surgery - even without additional medications.  Almost all patients can go home the day of surgery.  After Surgery ? You will have a sore throat from the metal instruments used to allow a good view  of your voice box for 3-5 days after surgery. Some patients may have sores on  the tongue or in the mouth. This is normal and you will be given pain  medications for this. If you have sores in the mouth, avoid citrus or acidic  foods/drinks since they will burn.  ? Avoid coughing or frequent throat clearing. Drinking water can help alleviate the  urge to clear the throat. ? Avoid any heavy lifting (more than 20 lbs), straining, exercise, or sports activities  for 2 weeks after surgery. ? Avoid alcohol, tobacco products, spicy foods, or eating late at night as these may  cause heartburn or stomach reflux and may delay the healing process. ? Your voice may be hoarse after surgery from swelling of the vocal cords caused  by manipulation. ? You do not have to avoid talking unless your surgeon gives you specific  instructions. Use your normal voice since whispering is harder on your vocal  cords then talking at a regular volume. ? If your surgeon advises voice rest, you should do the following: o You are to have absolute voice rest for 7 days. You may want to purchase  a dry erase board to communicate during this time. After that, you may  begin to use your voice at a soft spoken level. o You should only speak loud enough for people to hear you that are within  an arm's length away. Do not yell or whisper. You may increase your   voice use by 5 minutes/hour each day after the first 7 days of rest.  Medications ? Pain medication can be used for pain as prescribed. Pain in the throat and the  tongue is normal. ? Some patients will be given steroids or acid reducing medications after surgery,  take these as directed. ? Take all of your routine medications as prescribed, unless told otherwise by your  surgeon. Any medications that thin the blood should be avoided unless approved  by your surgeon.  ? IT IS OK TO TAKE OVER THE COUNTER PAIN MEDICATION  (IBUPROFEN, NAPROXEN, or ACETAMINOPHEN) IN ADDITION TO  YOUR PRESCRIBED MEDICATIONS. DO NOT TAKE ASPIRIN UNLESS  CLEARED WITH YOUR SURGEON.  ? Limit Acetaminophen/Tylenol to less than 4,000mg/day  ? Limit Ibuprofen/Motrin to less than 3,600mg/day  Final Result ? Following vocal cord injection, the voice may seem "strangled" due to swelling for  a few days or weeks. This is normal.  ? If you have a biopsy in surgery, you will usually find out the pathology results  when you are seen in the office for follow up. ? Many patients benefit from voice therapy after surgery to improve the long term  result. Your surgeon will recommend this if you could benefit from it  

## 2022-05-22 NOTE — Transfer of Care (Signed)
Immediate Anesthesia Transfer of Care Note  Patient: Elizabeth Velazquez  Procedure(s) Performed: MICROLARYNGOSCOPY WITH BIOPSY (Bilateral: Throat)  Patient Location: PACU  Anesthesia Type:General  Level of Consciousness: awake, alert , and oriented  Airway & Oxygen Therapy: Patient Spontanous Breathing  Post-op Assessment: Report given to RN and Post -op Vital signs reviewed and stable  Post vital signs: Reviewed and stable  Last Vitals:  Vitals Value Taken Time  BP 146/75 05/22/22 1000  Temp    Pulse 85 05/22/22 1002  Resp 18 05/22/22 1002  SpO2 96 % 05/22/22 1002  Vitals shown include unvalidated device data.  Last Pain:  Vitals:   05/22/22 0718  TempSrc:   PainSc: 0-No pain         Complications: No notable events documented.

## 2022-05-22 NOTE — Op Note (Signed)
OPERATIVE NOTE  [facility] Date/Time of Admission: 05/22/2022  6:21 AM  CSN: [id number] Attending Provider: Ebbie Latus A, DO Room/Bed: MCPO/NONE DOB: 1940-10-16 Age: 82 y.o.   Pre-Op Diagnosis: Squamous cell carcinoma of glottis  Reinke's edema of vocal folds Dysphonia Neoplasm of uncertain behavior of larynx Dysphagia, unspecified type  Post-Op Diagnosis: Squamous cell carcinoma of glottis Reinke's edema of vocal foldsDysphoniaNeoplasm of uncertain behavior of larynxDysphagia, unspecified type  Procedure: Procedure(s): MICROLARYNGOSCOPY WITH BIOPSY  Anesthesia: General  Surgeon(s): [place], DO  Staff: Circulator: Arnoldo Morale, RN; Rometta Emery, RN Scrub Person: Rolan Bucco  Implants: * No implants in log *  Specimens: ID Type Source Tests Collected by Time Destination  1 : left true vocal fold Tissue PATH ENT biopsy SURGICAL PATHOLOGY Obbie Lewallen A, DO [id number]     Complications: None  EBL: <1 ML  Condition: stable  Operative Findings:  Edema of bilateral true vocal folds noted with thickened secretions along margins of cords bilaterally, cleared with suction. Leukoplakic change only noted along margin of left true cord, which was biopsied. No evidence of exophytic mass or ulceration.   Description of Operation:  Once operative consent was obtained, and the surgical site confirmed with the operating room team, the patient was brought back to the operating room and general endotracheal anesthesia was obtained. The patient was turned over to the ENT service. An operating laryngoscope was used to directly visualize the upper airway and glottis. All anatomic areas from the oral cavity to the glottis were examined and noted to be normal with only exceptions noted in this report. Areas examined included the oropharynx, vallecula, both surfaces of the epiglottis, glottis, post cricoid region and bilateral  pyriform sinuses.   The patient was placed in laryngeal suspension with the focus on the glottis with the ET tube intact.   An operating microscope was used to visualize the vocal cords and a biopsy of the left cord was taken. Hemostasis was obtained with an afrin soaked pledget.   An oral gastric tube was placed into the stomach and suctioned to reduce postoperative nausea. The patient was turned back over to the anesthesia service. The patient was transferred to the PACU in stable condition.     Jason Coop, [facility]  05/22/2022

## 2022-05-22 NOTE — Telephone Encounter (Signed)
Per 2/16 IB reached out to patient to schedule , left message for patient to call back.

## 2022-05-23 NOTE — Anesthesia Postprocedure Evaluation (Signed)
Anesthesia Post Note  Patient: Elizabeth Velazquez  Procedure(s) Performed: MICROLARYNGOSCOPY WITH BIOPSY (Bilateral: Throat)     Patient location during evaluation: PACU Anesthesia Type: General Level of consciousness: awake Pain management: pain level controlled Vital Signs Assessment: post-procedure vital signs reviewed and stable Respiratory status: spontaneous breathing, nonlabored ventilation and respiratory function stable Cardiovascular status: blood pressure returned to baseline and stable Postop Assessment: no apparent nausea or vomiting Anesthetic complications: no   No notable events documented.  Last Vitals:  Vitals:   05/22/22 1015 05/22/22 1030  BP: (!) 114/58 (!) 134/99  Pulse: 84 83  Resp: 15 12  Temp:  36.7 C  SpO2: 94% 94%    Last Pain:  Vitals:   05/22/22 1030  TempSrc:   PainSc: 4                  Eevee Borbon P Tallia Moehring

## 2022-05-24 ENCOUNTER — Encounter (HOSPITAL_COMMUNITY): Payer: Self-pay | Admitting: Otolaryngology

## 2022-05-25 ENCOUNTER — Other Ambulatory Visit: Payer: Self-pay

## 2022-05-25 DIAGNOSIS — C320 Malignant neoplasm of glottis: Secondary | ICD-10-CM

## 2022-05-26 ENCOUNTER — Encounter: Payer: Medicare Other | Admitting: Dietician

## 2022-05-26 ENCOUNTER — Telehealth: Payer: Self-pay | Admitting: Radiation Oncology

## 2022-05-26 ENCOUNTER — Other Ambulatory Visit (HOSPITAL_COMMUNITY): Payer: Self-pay

## 2022-05-26 DIAGNOSIS — R1310 Dysphagia, unspecified: Secondary | ICD-10-CM

## 2022-05-26 LAB — SURGICAL PATHOLOGY

## 2022-05-26 NOTE — Telephone Encounter (Signed)
Patient's daughter called to reschedule missed 2/20 appointment. Patient rescheduled and will be notified. Forwarding patient concerns to RN.

## 2022-05-28 ENCOUNTER — Ambulatory Visit: Payer: Medicare Other

## 2022-06-02 ENCOUNTER — Inpatient Hospital Stay: Payer: Medicare Other | Attending: Dietician | Admitting: Dietician

## 2022-06-02 NOTE — Progress Notes (Signed)
Patient did not show for nutrition appointment. Message sent to scheduling to offer another appointment.

## 2022-06-08 NOTE — Progress Notes (Signed)
Oncology Nurse Navigator Documentation   I called and spoke with Elizabeth Velazquez and her daughter Elizabeth Velazquez on speaker phone. I reminded her of her appointment on 3/7 for her swallowing study. I explained that this was important to evaluate how safely she was swallowing and if there are concerns she will be scheduled to see Garald Balding SLP. They both voiced that they were aware of the appointment and planned to attend on Thursday. She also reported continued throat, back, and hip pain. She reports that she sees a provider in a pain management clinic but may be interested in a palliative care appointment which I will discuss with Dr. Isidore Moos and Dr. Fredric Dine.  They have my direct contact information to call me with any questions or concerns and know that I will call with updates after ENT conference on Wednesday.   Harlow Asa RN, BSN, OCN Head & Neck Oncology Nurse [place] [facility] Phone # [phone]  Fax # [phone]

## 2022-06-10 DIAGNOSIS — R070 Pain in throat: Secondary | ICD-10-CM | POA: Diagnosis not present

## 2022-06-11 ENCOUNTER — Ambulatory Visit (HOSPITAL_COMMUNITY)
Admission: RE | Admit: 2022-06-11 | Discharge: 2022-06-11 | Disposition: A | Discharge status: Home / Self Care | Payer: Medicare Other | Source: Ambulatory Visit | Attending: Registered Nurse | Admitting: Registered Nurse

## 2022-06-11 DIAGNOSIS — Z923 Personal history of irradiation: Secondary | ICD-10-CM | POA: Insufficient documentation

## 2022-06-11 DIAGNOSIS — R1312 Dysphagia, oropharyngeal phase: Secondary | ICD-10-CM | POA: Insufficient documentation

## 2022-06-11 DIAGNOSIS — R6339 Other feeding difficulties: Secondary | ICD-10-CM | POA: Diagnosis not present

## 2022-06-11 DIAGNOSIS — F17210 Nicotine dependence, cigarettes, uncomplicated: Secondary | ICD-10-CM | POA: Diagnosis not present

## 2022-06-11 DIAGNOSIS — C320 Malignant neoplasm of glottis: Secondary | ICD-10-CM

## 2022-06-11 DIAGNOSIS — R1310 Dysphagia, unspecified: Secondary | ICD-10-CM | POA: Diagnosis not present

## 2022-06-11 DIAGNOSIS — R490 Dysphonia: Secondary | ICD-10-CM | POA: Diagnosis not present

## 2022-06-11 DIAGNOSIS — K117 Disturbances of salivary secretion: Secondary | ICD-10-CM | POA: Insufficient documentation

## 2022-06-11 DIAGNOSIS — R1314 Dysphagia, pharyngoesophageal phase: Secondary | ICD-10-CM | POA: Diagnosis not present

## 2022-06-11 DIAGNOSIS — Z8521 Personal history of malignant neoplasm of larynx: Secondary | ICD-10-CM | POA: Diagnosis not present

## 2022-06-11 NOTE — Progress Notes (Signed)
Modified Barium Swallow Study  Patient Details  Name: Elizabeth Velazquez MRN: [id number] Date of Birth: [birth year]  Today's Date: 06/11/2022  HPI/PMH: HPI: per Dr Pearlie Oyster note "Elizabeth Velazquez is a 82 y.o. 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 She was last in our office on 10/20/2021. She was scheduled to see radiation oncology on 01/21/2022, but did not show up to her appointment. Reports that for the last several months, she has had difficulty with progressive dysphagia. She states that she is able to eat and drink all consistencies, but that it is more difficult to swallow, and takes a longer time to get food down". She reports weight loss has occured and dysphagia is source.  She continues to smoke on a daily basis but has cut her cigarettes back to 1 PPD.Marland Kitchen   Pt admits to issues swallowing both food and drink - but worse with foods.  She reports she has 3 trash cans in her house and will frequently expectorate in them during meals - but even without eating.  Ongoing issus with thick secretions despite her report of drinking plenty of liquids. Pt denies pneumonias nor requiring heimilch manuever *albeit came close to it*.  She has seen [name] and is sent in for MBS.  Prior esophagram 05/2022 showed severe esophageal dysmotility, laryngeal penetration and silent aspiration.   Pt admits to frustration with her dysphagia - reports she eats with family but will not go out to eat due to having to expectorate.   Clinical Impression: Clinical Impression: Patient presents with mild oral, severe pharyngeal and moderate cervical esophageal dysphagia due to iatrogenic effects of XRT.  Impaired motiltiy of pharyngeal strictures allow signfiicant pharyngeal retention (worse with solids than liquids, laryngeal  penetration of liquids and even of cracker bolus. Pt senses penetration of cracker prompting her to expel it into her mouth and expectorate.  Inconsistent sensation to laryngeal penetration/aspiration of liquids however.  Barium tablet taken with food *per pt* first stalled at vallecular space and then halted at pyriform sinus with next swallow - precariously close to open airway.  Liquid swallow assisted transiting into esophagus.  PES segement opening is impaired re: adequacy and timing - contributing to retention.  Reclining in chair to approx 40* allowed liquid barium to be retained in posterior pharynx resulting in less penetration/aspiration of liquids.  Pt frequently swallows, senses retention in pharynx - propels retention into oral cavity and reswallows - which is effective for her.  Fortuantely Elizabeth Velazquez has a strong cough and "hock" and is compensating for her dysphagia. SLP reviewed study in detail with pt - reviewing flouro loops and discussing need to complete exercises, follow up with OP SLP and dietician.  Given her leve of dysphagai, advised she consider obtaining some type of "choking" device - ie. life vac.  Advised she drink water and use reclined position, following solids with liquids, dry swallow and expectoration. She is managing her chronic aspiration but if she becomes acutely ill, tolerance may be compromised. Thanks for this consult.  Factors that may increase risk of adverse event in presence of aspiration ([place] 2021): Factors that may increase risk of adverse event in presence of aspiration ([place] 2021): Poor general health and/or compromised immunity; Reduced saliva   Recommendations/Plan: Swallowing Evaluation Recommendations Swallowing Evaluation Recommendations  Recommendations: PO diet PO Diet Recommendation: Dysphagia 3 (Mechanical soft); Full liquid diet; Thin liquids (Level 0) Liquid Administration via: Cup; Straw Medication Administration: Other  (Comment) Supervision: Patient able to self-feed Swallowing strategies  : Small bites/sips; Slow rate; Multiple dry swallows after each bite/sip; Follow solids with liquids; effortful swallow (propel bolus into oral cavity and reswallow or expel as needed, especially with solids) Postural changes: Partially reclined for meals Oral care recommendations: Oral care BID (2x/day)    Treatment Plan Treatment Plan Treatment recommendations: Defer treatment plan to SLP at other venue (see follow-up recommendations) Follow-up recommendations: Outpatient SLP     Recommendations Recommendations for follow up therapy are one component of a multi-disciplinary discharge planning process, led by the attending physician.  Recommendations may be updated based on patient status, additional functional criteria and insurance authorization.  Assessment: Orofacial Exam: Orofacial Exam Oral Cavity: Oral Hygiene: Xerostomia; Dried secretions Oral Cavity - Dentition: Dentures, top; Dentures, bottom Orofacial Anatomy: WFL Oral Motor/Sensory Function: WFL    Anatomy:  Anatomy: WFL   Thin Liquids: Thin Liquids (Level 0) Thin Liquids : Impaired Bolus delivery method: Spoon; Cup; Straw Thin Liquid - Impairment: Pharyngeal impairment; Esophageal impairment Initiation of swallow : Pyriform sinuses Soft palate elevation: No bolus between soft palate (SP)/pharyngeal wall (PW) Laryngeal elevation: Partial superior movement of thyroid cartilage/partial approximation of arytenoids to epiglottic petiole Anterior hyoid excursion: Partial Epiglottic movement: Partial Laryngeal vestibule closure: Incomplete, narrow column air/contrast in laryngeal vestibule Pharyngeal stripping wave : Present - diminished Pharyngeal contraction (A/P view only): N/A Pharyngoesophageal segment opening: Partial distention/partial duration, partial obstruction of flow Tongue base retraction: Narrow column of contrast or air  between tongue base and PPW Pharyngeal residue: Collection of residue within or on pharyngeal structures Location of pharyngeal residue: Valleculae; Pyriform sinuses; Aryepiglottic folds (larynx) Penetration/Aspiration Scale (PAS) score: 8.  Material enters airway, passes BELOW cords without attempt by patient to eject out (silent aspiration); 7.  Material enters airway, passes BELOW cords and not ejected out despite cough attempt by patient     Mildly Thick Liquids: Mildly thick liquids (Level 2, nectar thick) Mildly thick liquids (Level 2, nectar thick): Impaired Bolus delivery method: Cup; Straw; Spoon Mildly Thick Liquid - Impairment: Pharyngeal impairment Initiation of swallow : Pyriform sinuses Soft palate elevation: No bolus between soft palate (SP)/pharyngeal wall (PW) Laryngeal elevation: Partial superior movement of thyroid cartilage/partial approximation of arytenoids to epiglottic petiole Anterior hyoid excursion: Partial Epiglottic movement: Partial Laryngeal vestibule closure: Incomplete, narrow column air/contrast in laryngeal vestibule Pharyngeal stripping wave : Present - diminished Pharyngoesophageal segment opening: Partial distention/partial duration, partial obstruction of flow Tongue base retraction: Narrow column of contrast or air between tongue base and PPW Pharyngeal residue: Collection of residue within or on pharyngeal structures Location of pharyngeal residue: Tongue base; Valleculae; Pyriform sinuses; Pharyngeal wall; Aryepiglottic folds (larynx) Penetration/Aspiration Scale (PAS) score: 8.  Material enters airway, passes BELOW cords without attempt by patient to eject out (silent aspiration); 7.  Material enters airway, passes BELOW cords and not ejected out despite cough attempt by patient     Moderately Thick Liquids: Moderately thick liquids (Level 3, honey thick) Moderately thick liquids (Level 3, honey thick): Impaired Bolus delivery method:  Spoon Lip Closure: No labial escape Tongue control during bolus hold: Cohesive bolus between tongue to palatal seal Bolus transport/lingual motion: Repetitive/disorganized tongue motion Oral residue: Trace residue lining oral structures Location of oral residue : Tongue Initiation of swallow : Valleculae Soft palate elevation: No bolus between soft palate (SP)/pharyngeal  wall (PW) Laryngeal elevation: Partial superior movement of thyroid cartilage/partial approximation of arytenoids to epiglottic petiole Anterior hyoid excursion: Partial Epiglottic movement: Partial Laryngeal vestibule closure: Incomplete, narrow column air/contrast in laryngeal vestibule Pharyngeal stripping wave : Present - diminished Pharyngoesophageal segment opening: Partial distention/partial duration, partial obstruction of flow Tongue base retraction: Trace column of contrast or air between tongue base and PPW Pharyngeal residue: Collection of residue within or on pharyngeal structures Location of pharyngeal residue: Tongue base; Valleculae; Pharyngeal wall; Aryepiglottic folds; Pyriform sinuses     Puree: Puree Puree: Impaired Puree - Impairment: Oral Impairment; Pharyngeal impairment; Esophageal impairment Lip Closure: No labial escape Bolus transport/lingual motion: Delayed initiation of tongue motion (oral holding); Repetitive/disorganized tongue motion Oral residue: Trace residue lining oral structures Location of oral residue : Tongue Initiation of swallow: Valleculae Soft palate elevation: No bolus between soft palate (SP)/pharyngeal wall (PW) Laryngeal elevation: Partial superior movement of thyroid cartilage/partial approximation of arytenoids to epiglottic petiole Anterior hyoid excursion: Partial Epiglottic movement: Partial Laryngeal vestibule closure: Incomplete, narrow column air/contrast in laryngeal vestibule Pharyngeal stripping wave : Present - diminished Pharyngoesophageal segment  opening: Minimal distention/minimal duration, marked obstruction of flow Tongue base retraction: Trace column of contrast or air between tongue base and PPW Pharyngeal residue: Collection of residue within or on pharyngeal structures Location of pharyngeal residue: Tongue base; Valleculae; Pyriform sinuses; Diffuse (>3 areas) Penetration/Aspiration Scale (PAS) score: 1.  Material does not enter airway    Solid: Solid Solid - Impairment: Oral Impairment; Pharyngeal impairment; Esophageal impairment Lip Closure: No labial escape Bolus preparation/mastication: Minimal chewing/mashing with majority of bolus unchewed; Disorganized chewing/mashing with solid pieces of bolus unchewed Bolus transport/lingual motion: Brisk tongue motion Oral residue: Residue collection on oral structures Location of oral residue : Tongue Initiation of swallow: Valleculae Soft palate elevation: No bolus between soft palate (SP)/pharyngeal wall (PW) Laryngeal elevation: Partial superior movement of thyroid cartilage/partial approximation of arytenoids to epiglottic petiole Anterior hyoid excursion: Partial Epiglottic movement: Partial Laryngeal vestibule closure: Incomplete, narrow column air/contrast in laryngeal vestibule Pharyngeal stripping wave : Present - diminished Pharyngoesophageal segment opening: Partial distention/partial duration, partial obstruction of flow Tongue base retraction: Narrow column of contrast or air between tongue base and PPW Pharyngeal residue: Collection of residue within or on pharyngeal structures Location of pharyngeal residue: Tongue base; Valleculae; Aryepiglottic folds; Pyriform sinuses; Diffuse (>3 areas) Penetration/Aspiration Scale (PAS) score: 4.  Material enters airway, CONTACTS cords then ejected out Esophageal impairment: Esophageal retention    Pill: Pill Pill: Impaired Consistency administered : -- (solid and thin) Pill - Impairment: Oral Impairment; Pharyngeal  impairment; Esophageal impairment Lip Closure: No labial escape Bolus transport/lingual motion: Delayed initiation of tongue motion (oral holding) Oral residue: Trace residue lining oral structures Location of oral residue : Tongue Initiation of swallow : Valleculae Soft palate elevation: No bolus between soft palate (SP)/pharyngeal wall (PW) Laryngeal elevation: Partial superior movement of thyroid cartilage/partial approximation of arytenoids to epiglottic petiole Anterior hyoid excursion: Partial Epiglottic movement: Partial Laryngeal vestibule closure: Incomplete, narrow column air/contrast in laryngeal vestibule Pharyngeal stripping wave : Present - diminished Pharyngoesophageal segment opening: Partial distention/partial duration, partial obstruction of flow Tongue base retraction: Trace column of contrast or air between tongue base and PPW Pharyngeal residue: Collection of residue within or on pharyngeal structures Location of pharyngeal residue: Valleculae; Pharyngeal wall; Pyriform sinuses Penetration/Aspiration Scale (PAS) score: 1.  Material does not enter airway Esophageal impairment: Esophageal retention with retrograde flow below pharyngoesophageal segment (PES)    Compensatory Strategies: Compensatory Strategies Reclining posture:  Effective Effective Reclining Posture: Thin liquid (Level 0); Mildly thick liquid (Level 2, nectar thick) Posterior head tilt: Effective Effective Posterior head tilt: Thin liquid (Level 0); Mildly thick liquid (Level 2, nectar thick) Other(comment): Effective (cued expectoration) Effective Other(comment): Mildly thick liquid (Level 2, nectar thick); Puree; Solid       General Information: Caregiver present: No   Diet Prior to this Study: Regular; Thin liquids (Level 0)    Temperature : Normal    Respiratory Status: WFL    Supplemental O2: None (Room air)    History of Recent Intubation: No   Behavior/Cognition: Alert;  Cooperative; Pleasant mood  Self-Feeding Abilities: Able to self-feed  Baseline vocal quality/speech: Dysphonic  Volitional Cough: Able to elicit  Volitional Swallow: Able to elicit  Exam Limitations: Limited visibility   Goal Planning: Prognosis for improved oropharyngeal function: Fair  Barriers to Reach Goals: Time post onset  No data recorded No data recorded No data recorded  Pain: Pain Assessment Pain Assessment: No/denies pain    End of Session: Start Time:SLP Start Time (ACUTE ONLY): 1321  Stop Time: SLP Stop Time (ACUTE ONLY): 1405  Time Calculation:SLP Time Calculation (min) (ACUTE ONLY): 44 min  Charges: SLP Evaluations $ SLP Speech Visit: 1 Visit  SLP Evaluations $Outpatient MBS Swallow: 1 Procedure $Swallowing Treatment: 1 Procedure   SLP visit diagnosis: SLP Visit Diagnosis: Dysphagia, oropharyngeal phase (R13.12); Dysphagia, pharyngoesophageal phase (R13.14)    Past Medical History:  Past Medical History:  Diagnosis Date   Anxiety    Arthritis    Asthma    COPD (chronic obstructive pulmonary disease) (HCC)    Coronary artery disease    Depression    Dysphagia    Dyspnea    Fibromyalgia    GERD (gastroesophageal reflux disease)    History of hiatal hernia    Hyperlipidemia    Hypertension    Pneumonia    Shoulder fracture, left    Skin cancer    Wears dentures    Wears glasses    Past Surgical History:  Past Surgical History:  Procedure Laterality Date   ABDOMINAL HYSTERECTOMY     BALLOON DILATION N/A 12/16/2018   Procedure: BALLOON DILATION;  Surgeon: Clarene Essex, MD;  Location: [facility];  Service: Endoscopy;  Laterality: N/A;   COLONOSCOPY     ESOPHAGOGASTRODUODENOSCOPY (EGD) WITH PROPOFOL N/A 12/16/2018   Procedure: ESOPHAGOGASTRODUODENOSCOPY (EGD) WITH PROPOFOL, POSSIBLE BALLOON DILATION;  Surgeon: Clarene Essex, MD;  Location: [facility];  Service: Endoscopy;  Laterality: N/A;   HEMORRHOID SURGERY      MICROLARYNGOSCOPY N/A 06/04/2021   Procedure: MICROLARYNGOSCOPY;  Surgeon: Jason Coop, DO;  Location: [facility];  Service: ENT;  Laterality: N/A;   MICROLARYNGOSCOPY Bilateral 05/22/2022   Procedure: MICROLARYNGOSCOPY WITH BIOPSY;  Surgeon: Jason Coop, DO;  Location: [facility];  Service: ENT;  Laterality: Bilateral;   UPPER GI ENDOSCOPY    Kathleen Lime, Elizabeth [facility] SLP Acute Rehab Services Office [phone]   Macario Golds 06/11/2022, 3:32 PM

## 2022-06-18 ENCOUNTER — Ambulatory Visit: Payer: Medicare Other

## 2022-06-23 ENCOUNTER — Other Ambulatory Visit: Payer: Self-pay

## 2022-06-23 ENCOUNTER — Ambulatory Visit: Payer: Medicare Other | Attending: Otolaryngology

## 2022-06-23 DIAGNOSIS — C320 Malignant neoplasm of glottis: Secondary | ICD-10-CM | POA: Insufficient documentation

## 2022-06-23 DIAGNOSIS — R490 Dysphonia: Secondary | ICD-10-CM | POA: Diagnosis not present

## 2022-06-23 DIAGNOSIS — R1312 Dysphagia, oropharyngeal phase: Secondary | ICD-10-CM | POA: Diagnosis not present

## 2022-06-23 NOTE — Therapy (Signed)
OUTPATIENT SPEECH LANGUAGE PATHOLOGY SWALLOW EVALUATION   Patient Name: Elizabeth Velazquez MRN: [id number] DOB:[birth year], 82 y.o., female Today's Date: 06/23/2022  PCP: Holland Commons, FNP REFERRING PROVIDER: Jason Coop, DO   END OF SESSION:  End of Session - 06/23/22 1817     Visit Number 1    Number of Visits 17    Date for SLP Re-Evaluation 09/04/22    SLP Start Time [id number]   late 10 minutes   SLP Stop Time  69    SLP Time Calculation (min) 37 min    Activity Tolerance Patient tolerated treatment well             Past Medical History:  Diagnosis Date   Anxiety    Arthritis    Asthma    COPD (chronic obstructive pulmonary disease) ([place])    Coronary artery disease    Depression    Dysphagia    Dyspnea    Fibromyalgia    GERD (gastroesophageal reflux disease)    History of hiatal hernia    Hyperlipidemia    Hypertension    Pneumonia    Shoulder fracture, left    Skin cancer    Wears dentures    Wears glasses    Past Surgical History:  Procedure Laterality Date   ABDOMINAL HYSTERECTOMY     BALLOON DILATION N/A 12/16/2018   Procedure: BALLOON DILATION;  Surgeon: Clarene Essex, MD;  Location: [facility];  Service: Endoscopy;  Laterality: N/A;   COLONOSCOPY     ESOPHAGOGASTRODUODENOSCOPY (EGD) WITH PROPOFOL N/A 12/16/2018   Procedure: ESOPHAGOGASTRODUODENOSCOPY (EGD) WITH PROPOFOL, POSSIBLE BALLOON DILATION;  Surgeon: Clarene Essex, MD;  Location: [facility];  Service: Endoscopy;  Laterality: N/A;   HEMORRHOID SURGERY     MICROLARYNGOSCOPY N/A 06/04/2021   Procedure: MICROLARYNGOSCOPY;  Surgeon: Jason Coop, DO;  Location: [facility];  Service: ENT;  Laterality: N/A;   MICROLARYNGOSCOPY Bilateral 05/22/2022   Procedure: MICROLARYNGOSCOPY WITH BIOPSY;  Surgeon: Jason Coop, DO;  Location: [facility] OR;  Service: ENT;  Laterality: Bilateral;   UPPER GI ENDOSCOPY     Patient Active Problem List   Diagnosis Date Noted   Leukoplakia of vocal cords  05/22/2022   Exertional dyspnea 03/12/2022   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   Lesion of larynx 06/04/2021   Bruit of right carotid artery 06/02/2021   Cigarette nicotine dependence without complication [id number]   Coronary artery disease involving native coronary artery of native heart without angina pectoris 04/10/2019   Pulmonary emphysema ([place]) 04/10/2019   Mixed hyperlipidemia 04/10/2019    ONSET DATE: 2023 - script dated 05/22/22   REFERRING DIAG: C32.0 (ICD-10-CM) - Glottis carcinoma   THERAPY DIAG:  Dysphagia, oropharyngeal phase   Hoarseness  Rationale for Evaluation and Treatment: Rehabilitation  SUBJECTIVE:   SUBJECTIVE STATEMENT: ""I won't go out with my family to a restaurant." Pt accompanied by: family member daughter [name]  PERTINENT HISTORY: S/P glottis carcinoma - was treated with rad completed on 07/25/21. Pt cont to smoke 1PPD. She self-d/c'd from [facility] services after initial eval 07/10/21. From MBS notes 06/11/22: "She reports weight loss has occured and dysphagia is source. She continues to smoke on a daily basis but has cut her cigarettes back to 1 PPD.Marland Kitchen Pt admits to issues swallowing both food and drink - but worse with foods. She reports she has 3 trash cans in her house and will frequently expectorate in them during meals - but even without eating. Ongoing issus with thick secretions despite her report  of drinking plenty of liquids. Pt denies pneumonias nor requiring heimilch manuever *albeit came close to it*. She has seen [name] and is sent in for MBS. Prior esophagram 05/2022 showed severe esophageal dysmotility, laryngeal penetration and silent aspiration. Pt admits to frustration with her dysphagia - reports she eats with family but will not go out to eat due to having to expectorate."  PAIN:  Are you having pain? Yes: NPRS scale: 4/10 Pain location: throat Pain description: sore Aggravating factors: none Relieving factors: none  FALLS: Has patient fallen in  last 6 months?  No  LIVING ENVIRONMENT: Lives with: lives alone Lives in: House/apartment  PLOF:  Level of assistance: Independent with ADLs, Needed assistance with IADLS Employment: Retired  PATIENT GOALS: Improve swallowing  OBJECTIVE:   DIAGNOSTIC FINDINGS:   RECOMMENDATIONS FROM OBJECTIVE SWALLOW STUDY (MBSS/FEES):   Objective swallow impairments: Clinical Impression: Patient presents with mild oral, severe pharyngeal and moderate cervical esophageal dysphagia due to iatrogenic effects of XRT.  Impaired motiltiy of pharyngeal strictures allow signfiicant pharyngeal retention (worse with solids than liquids, laryngeal penetration of liquids and even of cracker bolus. Pt senses penetration of cracker prompting her to expel it into her mouth and expectorate.  Inconsistent sensation to laryngeal penetration/aspiration of liquids however.  Barium tablet taken with food *per pt* first stalled at vallecular space and then halted at pyriform sinus with next swallow - precariously close to open airway.  Liquid swallow assisted transiting into esophagus.   PES segement opening is impaired re: adequacy and timing - contributing to retention.  Reclining in chair to approx 40* allowed liquid barium to be retained in posterior pharynx resulting in less penetration/aspiration of liquids.  Pt frequently swallows, senses retention in pharynx - propels retention into oral cavity and reswallows - which is effective for her.   Fortunately [name] has a strong cough and "hock" and is compensating for her dysphagia. SLP reviewed study in detail with pt - reviewing flouro loops and discussing need to complete exercises, follow up with OP SLP and dietician.   Given her level of dysphagia, advised she consider obtaining some type of "choking" device - ie. life vac.  Advised she drink water and use reclined position, following solids with liquids, dry swallow and expectoration. She is managing her chronic aspiration  but if she becomes acutely ill, tolerance may be compromised. Thanks for this consult. Dys III diet and thin liquids recommended with following compensations: Small bites/sips; Slow rate; Multiple dry swallows after each bite/sip; Follow solids with liquids; effortful swallow (propel bolus into oral cavity and reswallow or expel as needed, especially with solids)   COGNITION: Overall cognitive status: Impaired Areas of impairment:  Memory: Impaired: Short term Functional deficits: Pt did not recall getting any paperwork from MBS re: precautions and results. Pt did not recall any swallow strategies/compensations/precautions from MBS until SLP began asking her questions about precautions.  She was not following recommended diet, as she was including dry and crumbly items, and dense/tough meats "(My son) fixes me pork chops. I love pork chops")  ORAL MOTOR EXAMINATION: Overall status: WFL Comments: Pt with mildly hoarse voice  CLINICAL SWALLOW ASSESSMENT:   Current diet: regular and thin liquids; pt remarked "a little piece of cookie or cracker can really make me cough" and that she is eating meats such as pork chops. Dentition: adequate natural dentition Patient directly observed with POs: Yes: thin liquids  due to time constraints, no solid foods were observed. SLP provided dysphagia diets handout  and highlighted foods pt was assessed as safer to consume. SLP to observe pt with POs next visit. Feeding: able to feed self Liquids provided by: cup Oral phase signs and symptoms: prolonged bolus formation Pharyngeal phase signs and symptoms: multiple swallows, audible swallow, wet vocal quality, immediate throat clear, and complaints of residue  PATIENT REPORTED OUTCOME MEASURES (PROM): EAT-10: next session   TODAY'S TREATMENT:                                                                                                                                         DATE:  06/23/22: (date of eval)  SLP educated pt and Angel about how to complete HEP - pt req'd consistent mod-max A initially, faded to occasional min A. SLP will cont to reinforce correct production of HEP and told pt she would need to attend ST x2/week initially. Angel told SLP that pt frequently does not feel well and may need to cx some sessions of ST. SLP reiterated that pt will need to complete HEP for at least 8 weeks, and complete HEP correctly, and this would be easier with consistent monitoring of HEP with SLP.Marland Kitchen  SLP went over precautions with pt and Angel with H2O, which pt req'd usual mod A faded to occasional min-mod A. SLP encouraged [name] to have someone sit with pt for first 3-4 days with POs to assist her recall of precautions.  Lastly, SLP highlighted "allowed" foods for Katarina, stressing that highlighted foods are foods that she will likely have less trouble with, unlike pork chops, crackers, and cookies. Currently pt is taking meds with crackers. SLP suggested whole in applesauce, or crushing meds that she can and putting in applesauce.  PATIENT EDUCATION: Education details: see "today's treatment" Person educated: Patient and Child(ren) Education method: Explanation, Demonstration, Verbal cues, and Handouts Education comprehension: verbalized understanding, returned demonstration, verbal cues required, and needs further education   ASSESSMENT:  CLINICAL IMPRESSION: Patient is a 82 y.o. female who was seen today for ST following MBS on 06/11/22. See above for more details. Given what SLP saw today re: pt's memory, pt will need to have consistent follow up in [place] for at least 3-4 weeks, or consistent family attendance, in order to complete HEP correctly and to adhere to swallow precautions from [facility] on 06/11/22.   OBJECTIVE IMPAIRMENTS: include memory, voice disorder, and dysphagia. These impairments are limiting patient from ADLs/IADLs, effectively communicating at home and in community, and safety when  swallowing. Factors affecting potential to achieve goals and functional outcome are ability to learn/carryover information, cooperation/participation level, and severity of impairments. Patient will benefit from skilled SLP services to address above impairments and improve overall function.  REHAB POTENTIAL: Good, however pt has severe deficits and memory issues which could negatively affect rehab potential    GOALS: Goals reviewed with patient? Yes  SHORT TERM GOALS: Target date: 07/27/22  Pt perform HEP  with occasional min A in 3 sessions Baseline: Goal status: INITIAL  2.  Pt will follow precautions stated in her MBS on 06/11/22 with rare min A in 3 sessions  Baseline:  Goal status: INITIAL  3.  Pt will tell SLP 3 overt s/sx aspiration PNA with modified independence Baseline:  Goal status: INITIAL  4.  Pt will indicate she knows difference between regular diet foods and dys I-III foods with modified independence in 2 sessions Baseline:  Goal status: INITIAL   LONG TERM GOALS: Target date: 09/04/22  Pt perform HEP with modified independence in 3 sessions Baseline:  Goal status: INITIAL  2.  Pt will demo swallow precautions from [place] on 06/11/22 with modified independence in 3 sessions Baseline:  Goal status: INITIAL  3.  Pt will score higher with EAT-10 than initial administration Baseline:  Goal status: INITIAL  4.  Pt will demo greater comfort/confidence about her swallowing in order to go to a restaurant with family x1 Baseline:  Goal status: INITIAL  5.  PT will undergo follow up MBS if clinically indicated Baseline:  Goal status: INITIAL   PLAN:  SLP FREQUENCY: 2x/week  SLP DURATION: 8 weeks  PLANNED INTERVENTIONS: Aspiration precaution training, Pharyngeal strengthening exercises, Diet toleration management , Environmental controls, Trials of upgraded texture/liquids, Cueing hierachy, Internal/external aids, SLP instruction and feedback, Compensatory  strategies, and Patient/family education    [facility], [place] 06/23/2022, 7:05 PM

## 2022-06-23 NOTE — Patient Instructions (Addendum)
  SWALLOWING EXERCISES Do these until 6 months after your last day of radiation, then 2-3 times per week afterwards  Effortful Swallows - swallow HARD on your saliva or a small sip of water - Repeat 10-15 times, 2 times a day  Pitch Raise - Say "he", starting at as low note as you can and as high as you can - Repeat 20 times, 2 times a day  [employer] - "half swallow" exercise - Start to swallow, and keep your Adam's apple up by squeezing hard with the muscles of the throat - Hold the squeeze for 5-7 seconds and then relax - Repeat 10-15 times, 2 times a day *use a drop of water if your mouth gets dry*  10.   "Super Swallow"                    use a drop of water if you need it  - Take a breath and hold it  - Swallow then IMMEDIATELY cough  - Repeat 10-15 times, 2 times a day    WHEN YOU EAT: Small bites and sips Swallow 2-3 times EVERY BITE or SIP Alternate: bite-sip-bite-sip-bite-sip-bite-sip HARD SWALLOWS If you need to, push food back up and reswallow or expel

## 2022-06-29 ENCOUNTER — Ambulatory Visit: Payer: Medicare Other

## 2022-07-01 ENCOUNTER — Ambulatory Visit: Payer: Medicare Other

## 2022-07-06 ENCOUNTER — Ambulatory Visit: Payer: Medicare Other

## 2022-07-08 ENCOUNTER — Ambulatory Visit: Payer: Medicare Other | Attending: Otolaryngology

## 2022-07-08 NOTE — Addendum Note (Signed)
Encounter addended by: Narda Rutherford, CCC-SLP on: 07/08/2022 5:57 PM  Actions taken: Clinical Note Signed

## 2022-07-09 DIAGNOSIS — R070 Pain in throat: Secondary | ICD-10-CM | POA: Diagnosis not present

## 2022-07-15 ENCOUNTER — Ambulatory Visit: Payer: Medicare Other

## 2022-07-31 DIAGNOSIS — R634 Abnormal weight loss: Secondary | ICD-10-CM | POA: Diagnosis not present

## 2022-07-31 DIAGNOSIS — R1310 Dysphagia, unspecified: Secondary | ICD-10-CM | POA: Diagnosis not present

## 2022-07-31 DIAGNOSIS — C320 Malignant neoplasm of glottis: Secondary | ICD-10-CM | POA: Diagnosis not present

## 2022-07-31 DIAGNOSIS — G894 Chronic pain syndrome: Secondary | ICD-10-CM | POA: Diagnosis not present

## 2022-08-11 ENCOUNTER — Other Ambulatory Visit: Payer: Self-pay | Admitting: Cardiology

## 2022-08-11 DIAGNOSIS — I10 Essential (primary) hypertension: Secondary | ICD-10-CM

## 2022-10-14 DIAGNOSIS — R634 Abnormal weight loss: Secondary | ICD-10-CM | POA: Diagnosis not present

## 2022-10-14 DIAGNOSIS — G894 Chronic pain syndrome: Secondary | ICD-10-CM | POA: Diagnosis not present

## 2022-10-14 DIAGNOSIS — R1310 Dysphagia, unspecified: Secondary | ICD-10-CM | POA: Diagnosis not present

## 2022-10-14 DIAGNOSIS — C320 Malignant neoplasm of glottis: Secondary | ICD-10-CM | POA: Diagnosis not present

## 2022-11-26 DIAGNOSIS — R1310 Dysphagia, unspecified: Secondary | ICD-10-CM | POA: Diagnosis not present

## 2022-11-26 DIAGNOSIS — C320 Malignant neoplasm of glottis: Secondary | ICD-10-CM | POA: Diagnosis not present

## 2022-11-26 DIAGNOSIS — G894 Chronic pain syndrome: Secondary | ICD-10-CM | POA: Diagnosis not present

## 2022-11-26 DIAGNOSIS — R634 Abnormal weight loss: Secondary | ICD-10-CM | POA: Diagnosis not present

## 2023-01-13 DIAGNOSIS — C44329 Squamous cell carcinoma of skin of other parts of face: Secondary | ICD-10-CM | POA: Diagnosis not present

## 2023-01-13 DIAGNOSIS — C44622 Squamous cell carcinoma of skin of right upper limb, including shoulder: Secondary | ICD-10-CM | POA: Diagnosis not present

## 2023-02-07 ENCOUNTER — Other Ambulatory Visit: Payer: Self-pay | Admitting: Cardiology

## 2023-02-07 DIAGNOSIS — I10 Essential (primary) hypertension: Secondary | ICD-10-CM

## 2023-02-08 DIAGNOSIS — C44329 Squamous cell carcinoma of skin of other parts of face: Secondary | ICD-10-CM | POA: Diagnosis not present

## 2023-02-17 DIAGNOSIS — C44622 Squamous cell carcinoma of skin of right upper limb, including shoulder: Secondary | ICD-10-CM | POA: Diagnosis not present

## 2023-04-13 IMAGING — CR DG CHEST 2V
2 series · 2 of 2 positions shown · non-contrast
Comparison: May 19, 2017

CLINICAL DATA: sob

EXAM:
CHEST - 2 VIEW

[w chest lat]
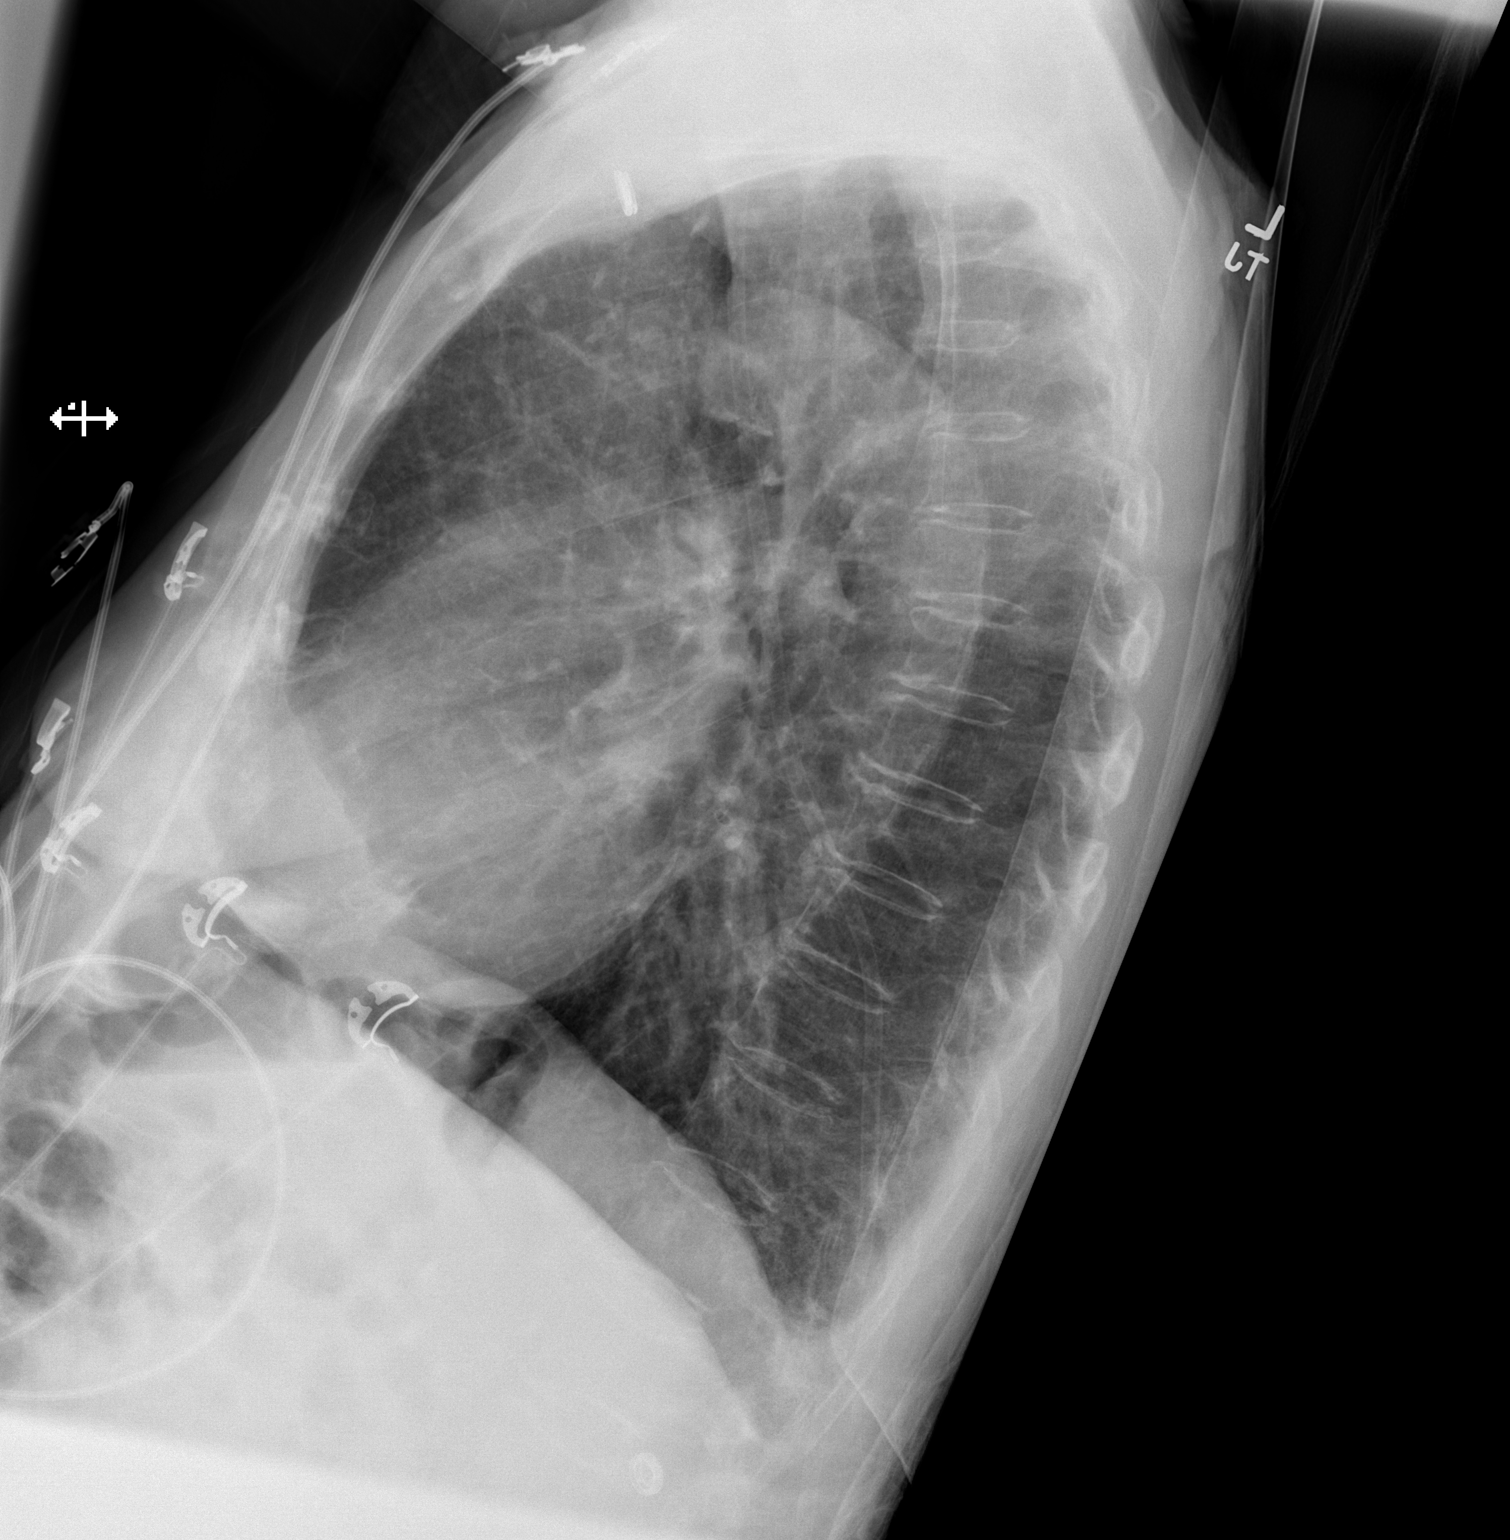

[x chest ap]
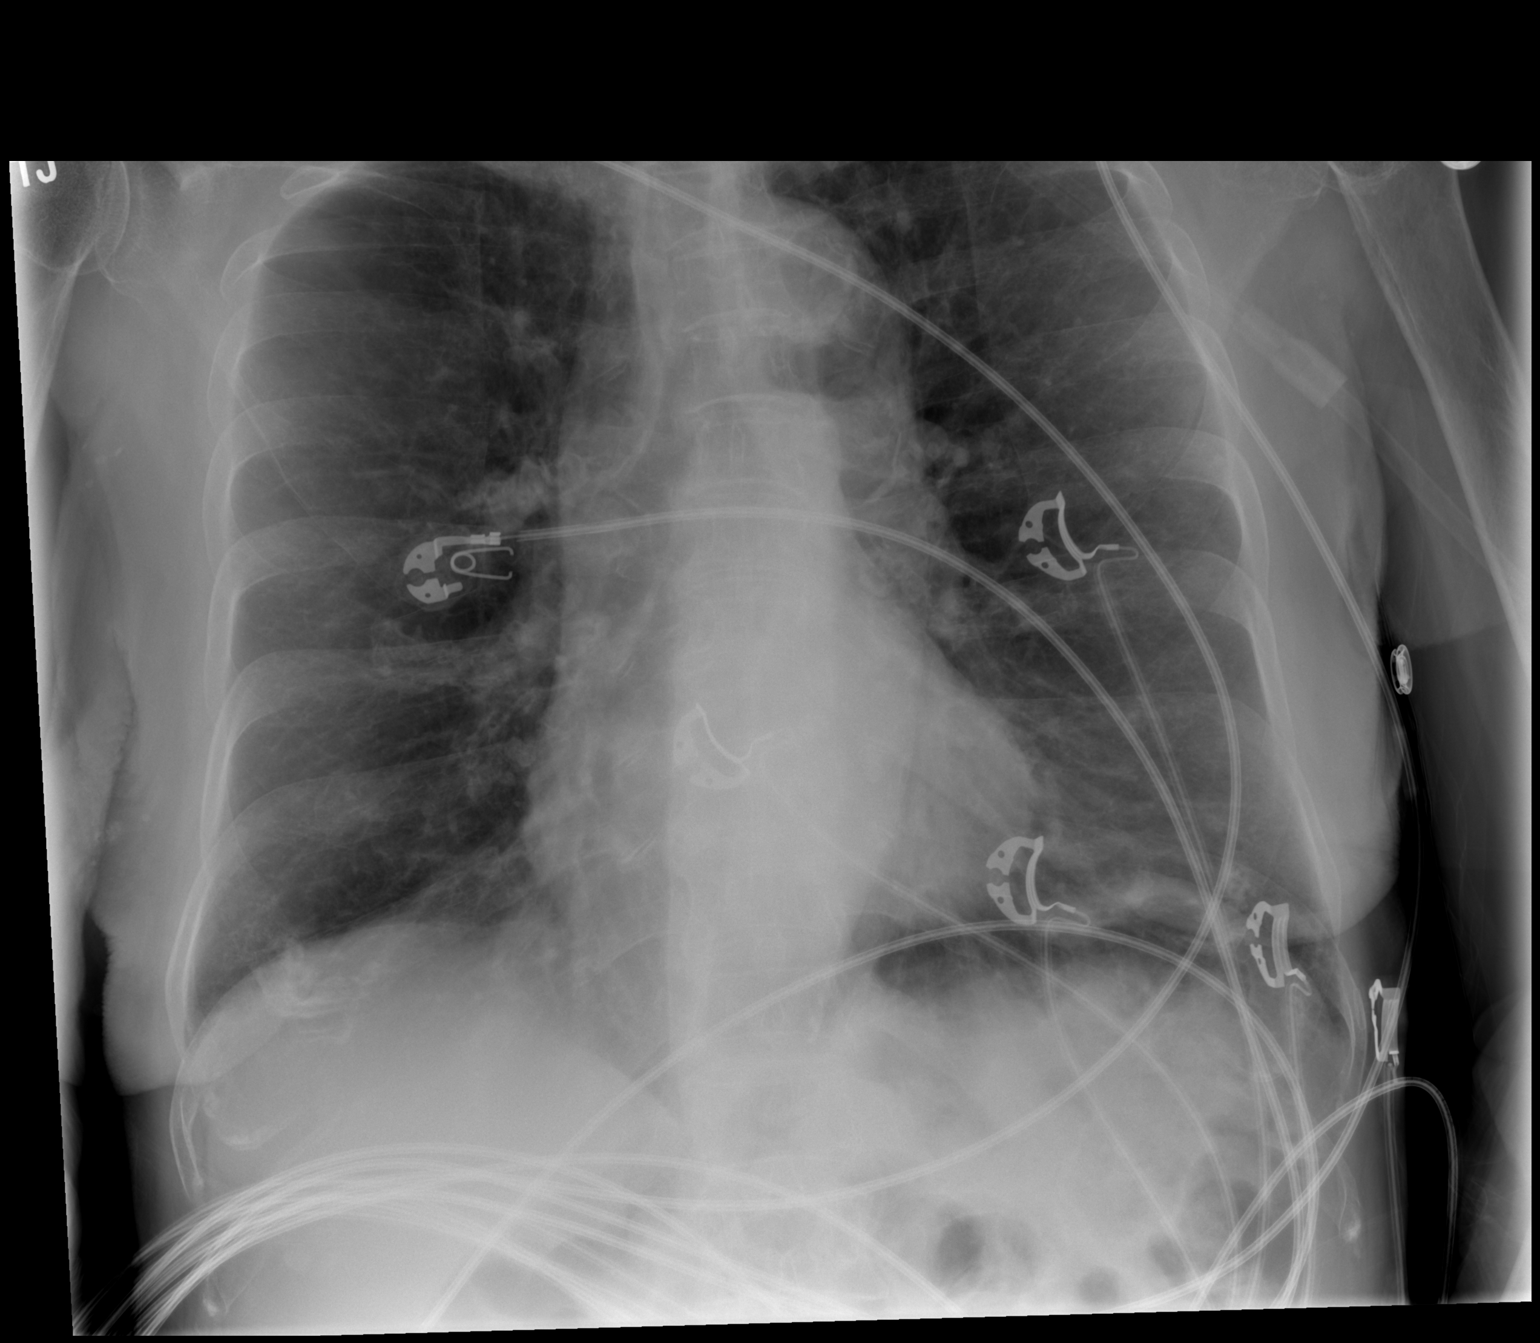

[2 of 2 positions shown; findings below may reference images not displayed]

FINDINGS: The heart size and mediastinal contours are within normal limits.
Thoracic aorta is ectatic with atheromatous calcifications at the
arch of the aorta. No consolidation, pleural effusion or significant
vascular congestion. Stable chronic interstitial prominence. The
visualized skeletal structures are unremarkable.
IMPRESSION: No active cardiopulmonary disease.

## 2023-04-13 IMAGING — CT CT NECK W/ CM
4 of 5 series · 14 of 33 positions shown, 16 images · IV contrast (agent unspecified)
Comparison: None Available.

CLINICAL DATA: Provided history: Epiglottitis or tonsillitis
suspected. Additional history provided: Patient reports shortness of
breath and cough for 1 week. Patient reports feeling as though she
is choking. Additional history obtained from prior radiology
records: History of glottic carcinoma.

EXAM:
CT NECK WITH CONTRAST
TECHNIQUE: Multidetector CT imaging of the neck was performed using the
standard protocol following the bolus administration of intravenous
contrast.

[Series 2: axial neck · axial · 0.41mm/px · z∈[-180,-54]mm · 3 of 127 slices shown]
[im 32/127  bone]
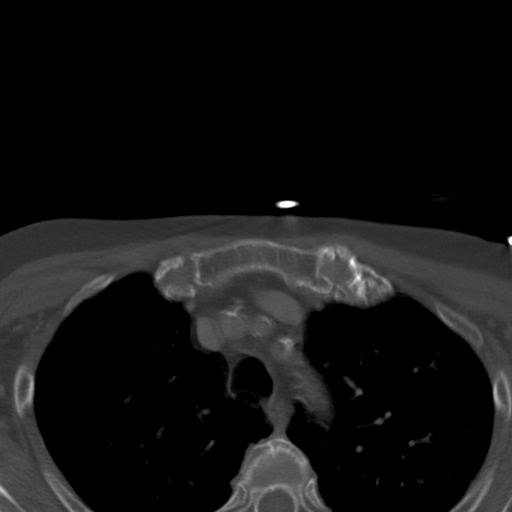
[im 64/127  bone]
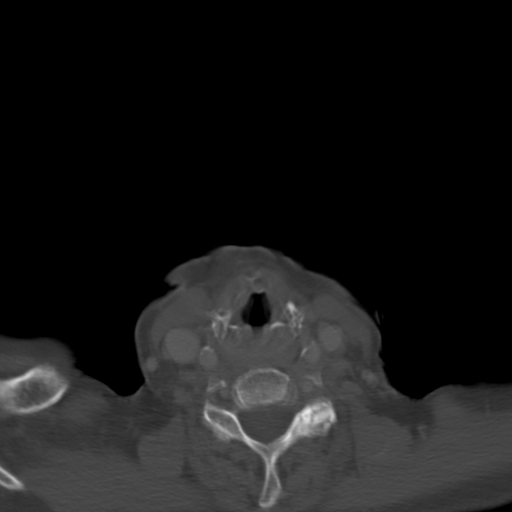
[im 95/127  bone]
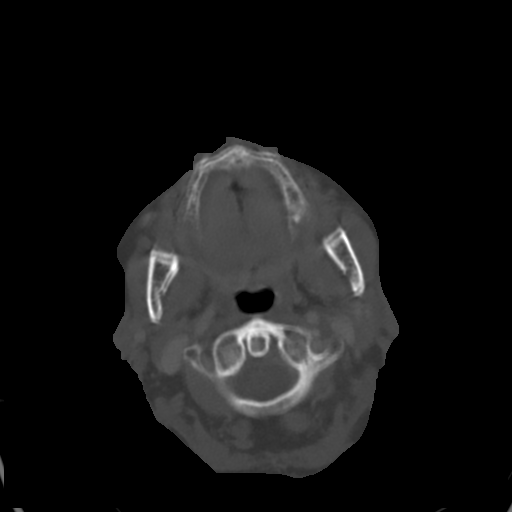

[Series 5: orthogonal (person_name) · axial · 0.39mm/px · z∈[-228,-97]mm · 3 of 143 slices shown, 4 images]
[im 36/143  soft-tissue]
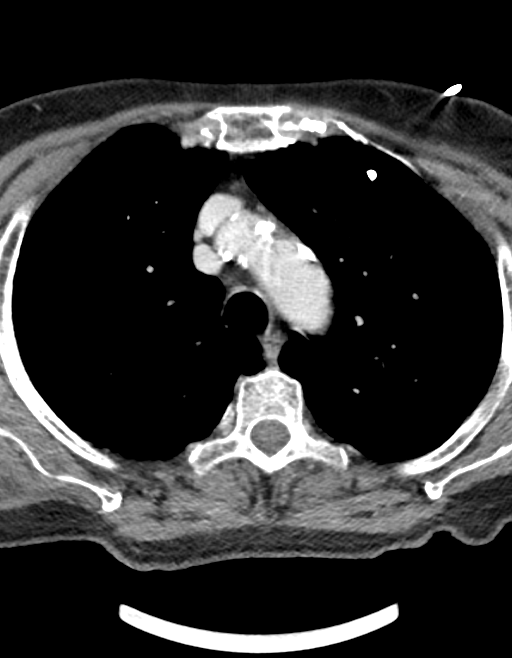
[im 36/143  bone]
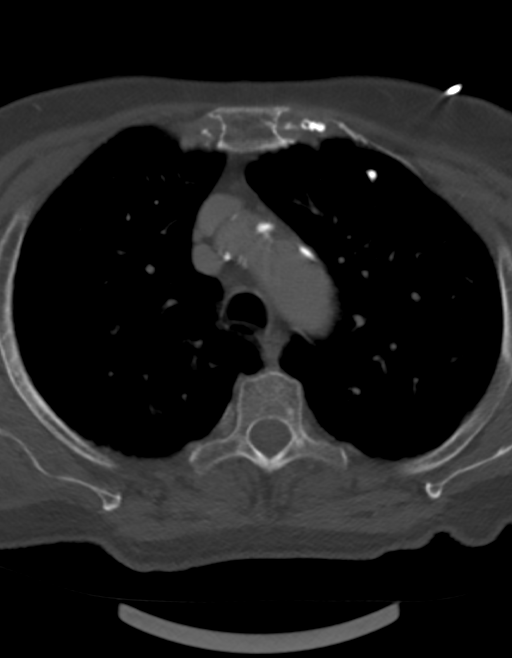
[im 72/143  bone]
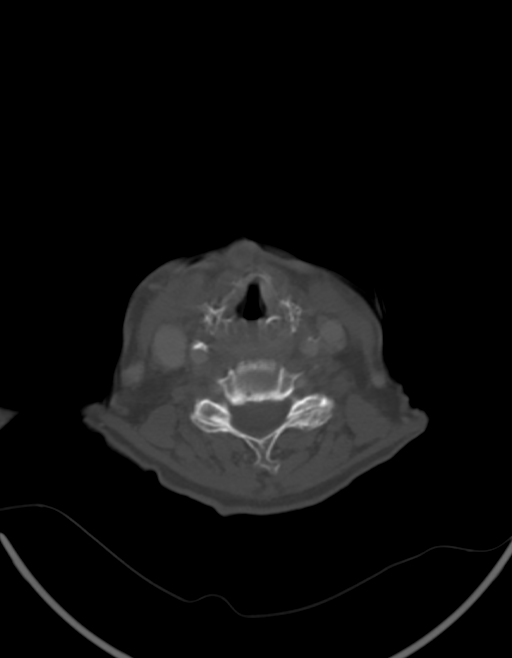
[im 107/143  bone]
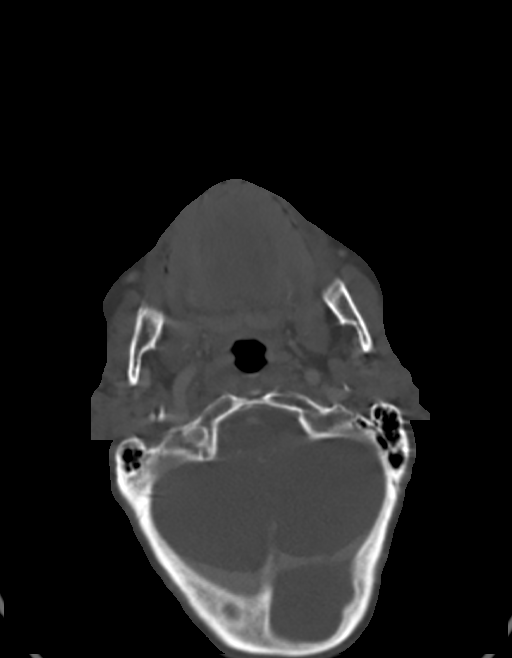

[Series 6: cor neck · coronal · 0.49mm/px · 3 of 123 slices shown]
[im 25/123  bone]
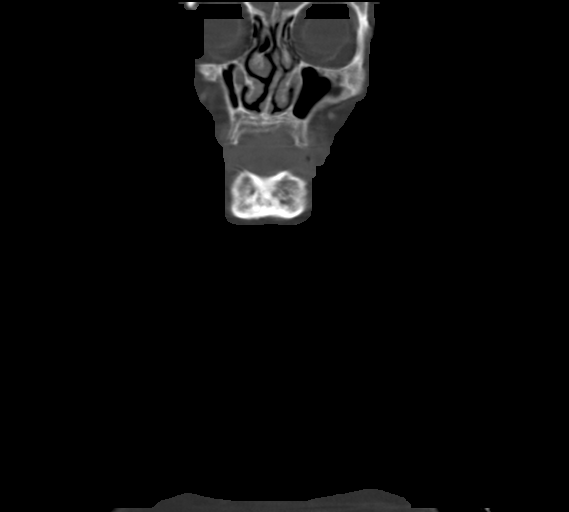
[im 49/123  bone]
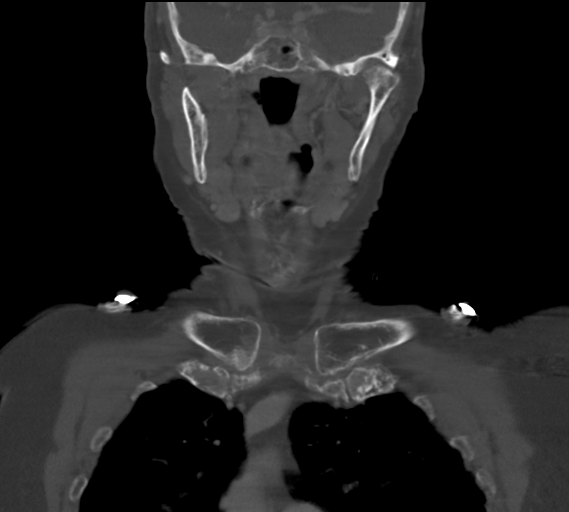
[im 74/123  bone]
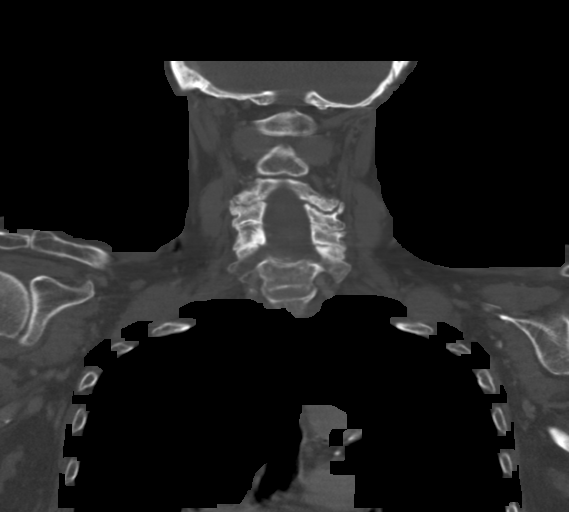

[Series 7: sag neck · sagittal · 0.48mm/px · 5 of 141 slices shown, 6 images]
[im 47/141  bone]
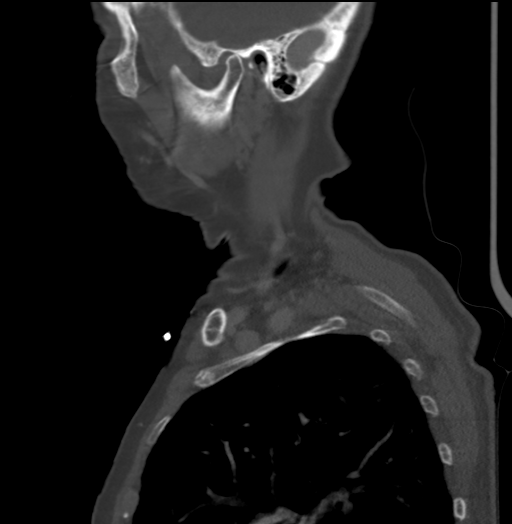
[im 59/141  bone]
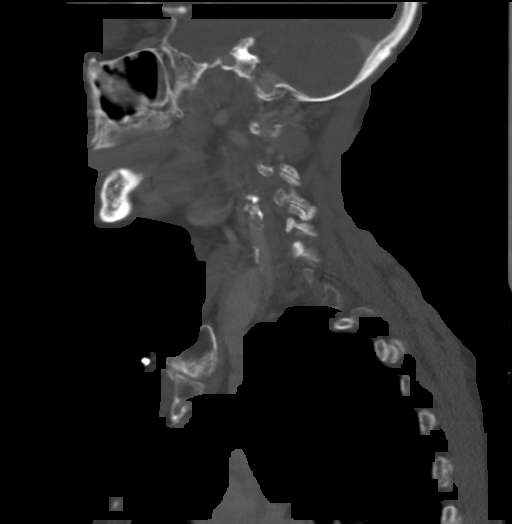
[im 71/141  soft-tissue]
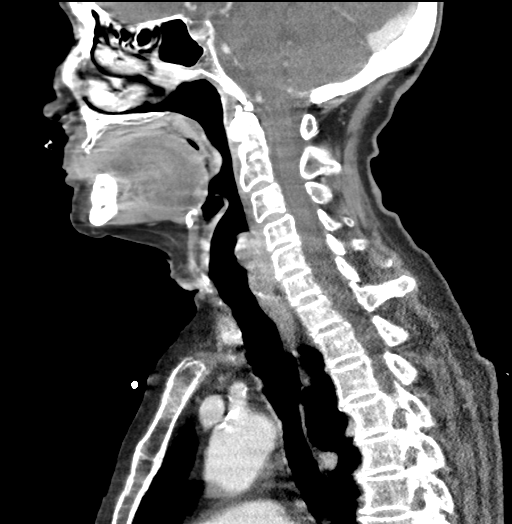
[im 71/141  bone]
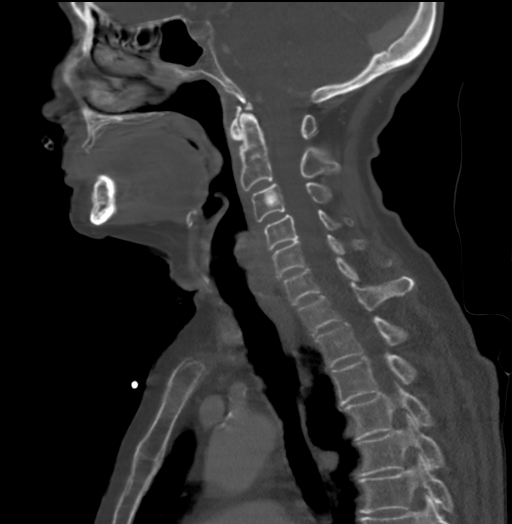
[im 82/141  bone]
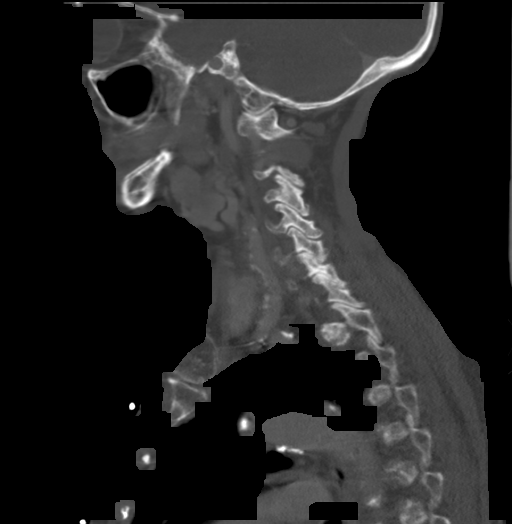
[im 94/141  bone]
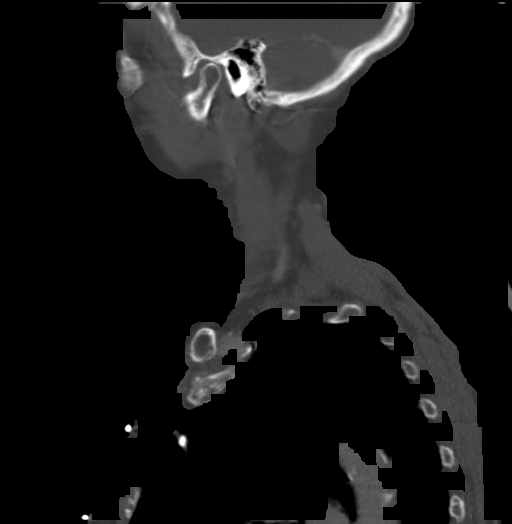

[14 of 33 positions shown; findings below may reference images not displayed]

RADIATION DOSE REDUCTION: This exam was performed according to the
departmental dose-optimization program which includes automated
exposure control, adjustment of the mA and/or kV according to
patient size and/or use of iterative reconstruction technique.

CONTRAST:  75mL OMNIPAQUE IOHEXOL 350 MG/ML SOLN
FINDINGS: Mild to moderately motion degraded exam.

Pharynx and larynx: Within limitations of motion degradation, no
swelling or discrete mass is identified within the oral cavity,
pharynx or larynx. The previously demonstrated subtle asymmetric
prominence of the anterior left glottis is no longer definitively
appreciated.

Salivary glands: No inflammation, mass, or stone.

Thyroid: Unchanged 11 mm nodule within the thyroid isthmus, not
meeting consensus criteria for ultrasound follow-up based on size.

Lymph nodes: No pathologically enlarged lymph nodes identified
within the neck.

Vascular: The major vascular structures of the neck are patent.
Atherosclerotic plaque within the visualized aortic arch, proximal
major branch vessels of the neck, as well as carotid and vertebral
arteries. Atherosclerotic plaque at the right ICA origin could
result in a hemodynamically significant stenosis.

Limited intracranial: No evidence of acute intracranial abnormality
within the field of view.

Visualized orbits: No orbital mass or acute orbital finding.

Mastoids and visualized paranasal sinuses: Small fluid level within
the right maxillary sinus. No significant mastoid effusion.

Skeleton: Small sclerotic focus within the C3 vertebral body,
chronic, unchanged and likely benign. No acute bony abnormality or
aggressive osseous lesion. Mild C2-C3 and C3-C4 grade 1
anterolisthesis. Cervical spondylosis. No appreciable high-grade
spinal canal stenosis. Multilevel bony neural foraminal narrowing.

Upper chest: Emphysema. No consolidation within the imaged lung
apices. Left upper lobe calcified granuloma.

Other: Unchanged 1 cm ovoid low-density cutaneous/subcutaneous
lesion within the anterior midline neck at the level of the thyroid
cartilage. This is again favored to reflect an epidermal inclusion
cyst.
IMPRESSION: 1. Mild-to-moderately motion degraded examination.
2. Within the limitations of motion degradation, there is no
appreciable swelling or discrete mass within the oral cavity,
pharynx or larynx.
3. Unchanged 1 cm ovoid low-density cutaneous/subcutaneous lesion
within the anterior midline neck at the level of the thyroid
cartilage, favored to reflect an epidermal inclusion cyst. Direct
visualization recommended.
4. Atherosclerotic plaque within the visualized aortic arch,
proximal major branch vessels of the neck, as well as carotid and
vertebral arteries. Atherosclerotic plaque could result in a
hemodynamically significant stenosis at the origin of the right ICA
(venous contrast timing limits evaluation on the current exam).
Consider a carotid artery duplex for further evaluation.
5. Mild right maxillary sinusitis.
6. Cervical spondylosis.
7. Aortic Atherosclerosis (VQNU7-5S4.4) and Emphysema (VQNU7-MFL.H).

## 2023-04-13 IMAGING — CT CT ANGIO CHEST
2 of 7 series · 17 of 46 positions shown · IV contrast (agent unspecified)
Comparison: Chest x-ray from same day. CT chest dated May 14, 2021.

CLINICAL DATA: Cough and shortness of breath for the past week.

EXAM:
CT ANGIOGRAPHY CHEST WITH CONTRAST
TECHNIQUE: Multidetector CT imaging of the chest was performed using the
standard protocol during bolus administration of intravenous
contrast. Multiplanar CT image reconstructions and MIPs were
obtained to evaluate the vascular anatomy.

[Series 7: thins · axial · 0.64mm/px · z∈[-399,-157]mm · 15 of 278 slices shown]
[im 18/278  lung]
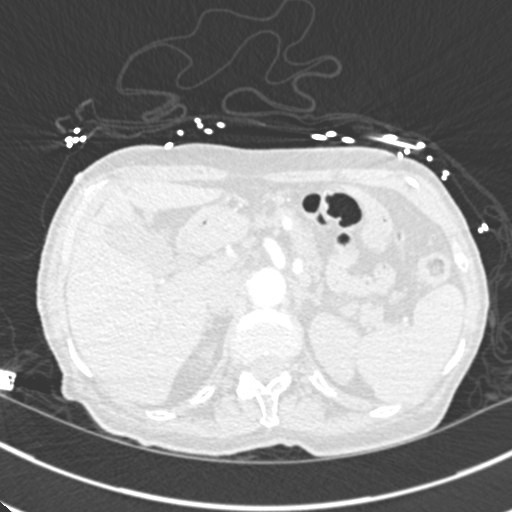
[im 35/278  soft-tissue]
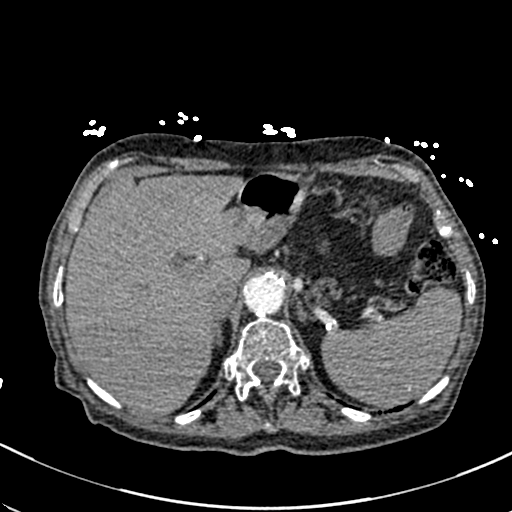
[im 52/278  lung]
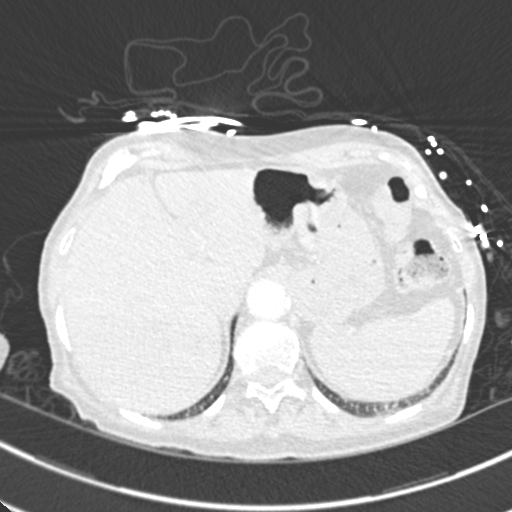
[im 70/278  soft-tissue]
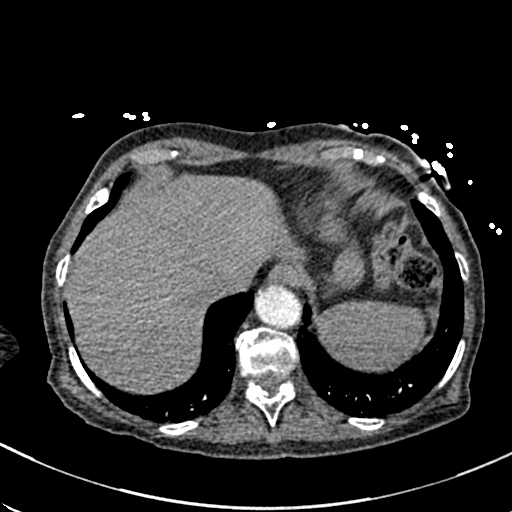
[im 87/278  lung]
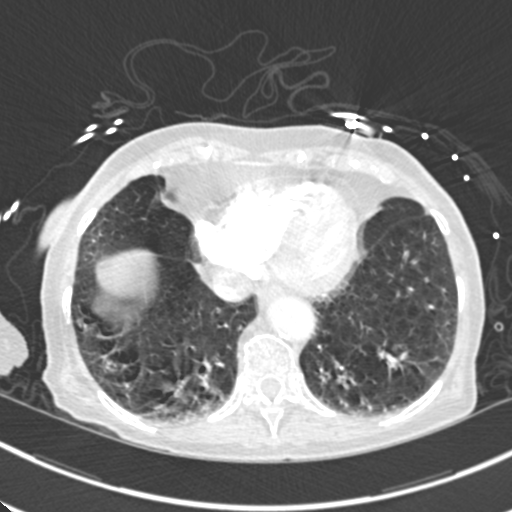
[im 104/278  soft-tissue]
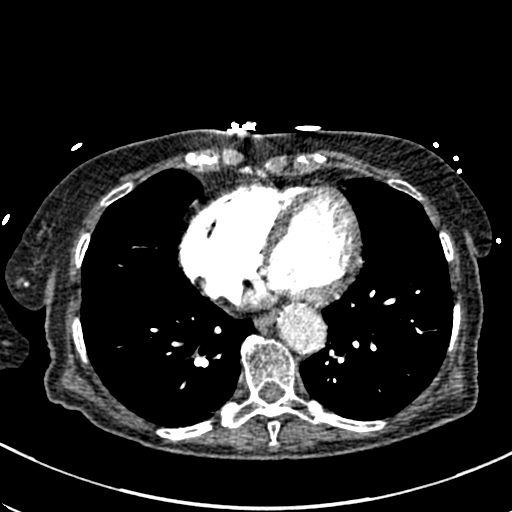
[im 122/278  lung]
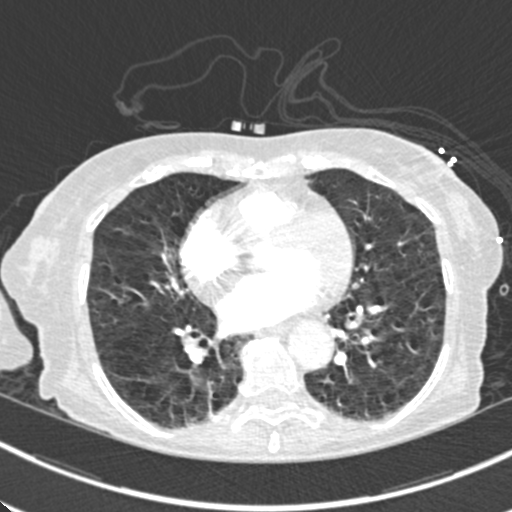
[im 139/278  soft-tissue]
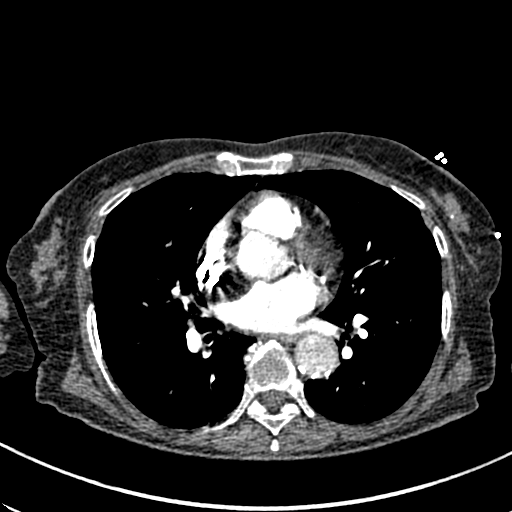
[im 156/278  lung]
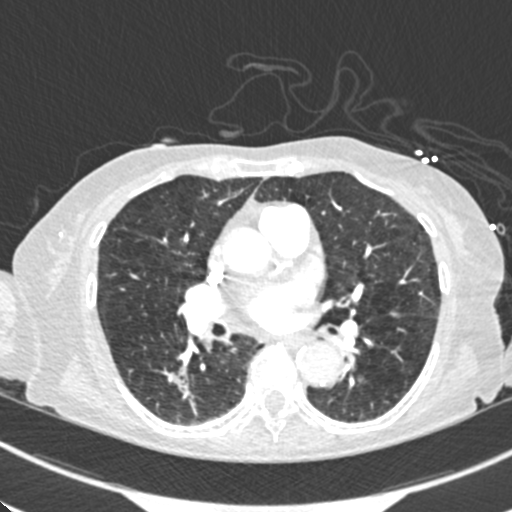
[im 174/278  soft-tissue]
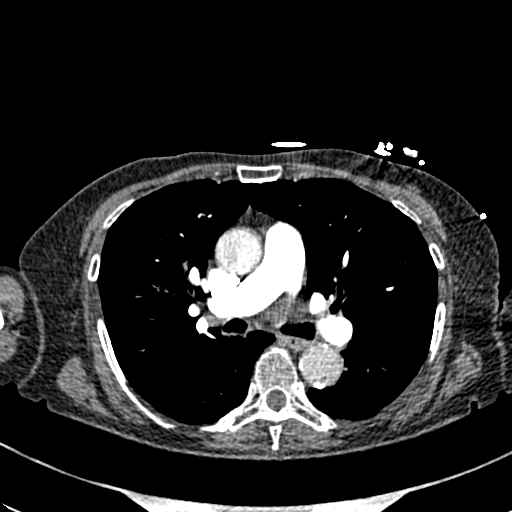
[im 191/278  lung]
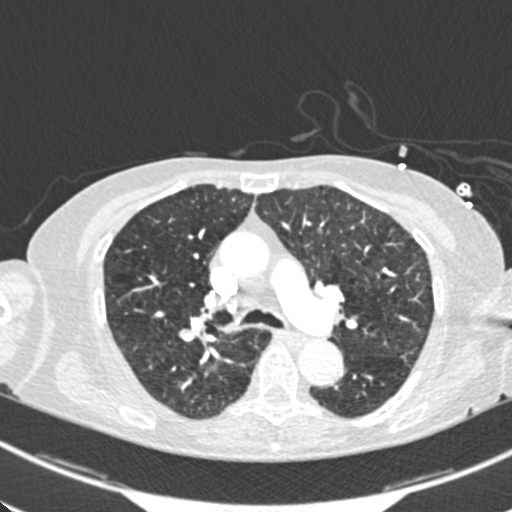
[im 208/278  soft-tissue]
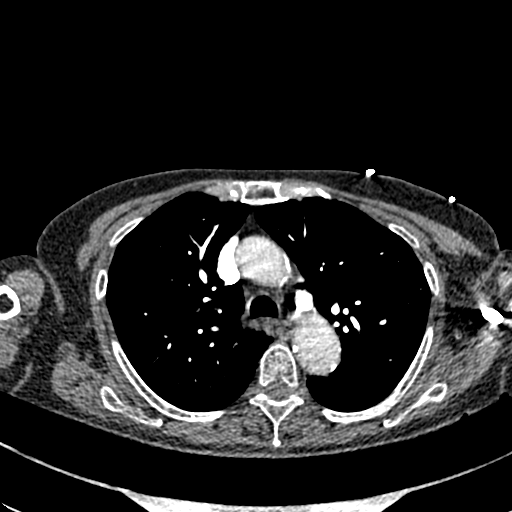
[im 226/278  lung]
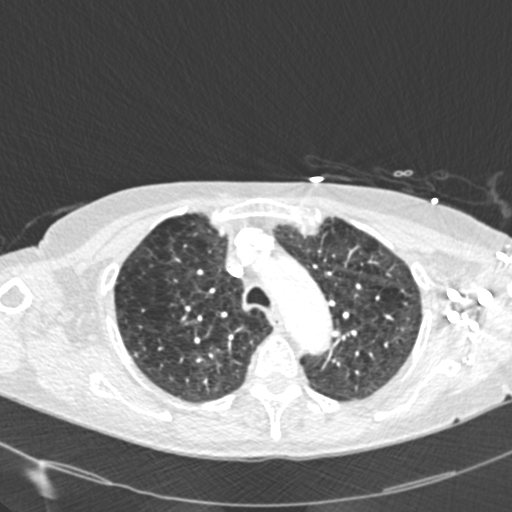
[im 243/278  soft-tissue]
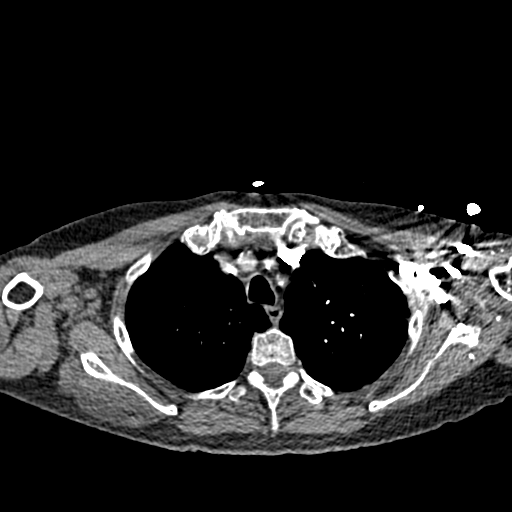
[im 260/278  lung]
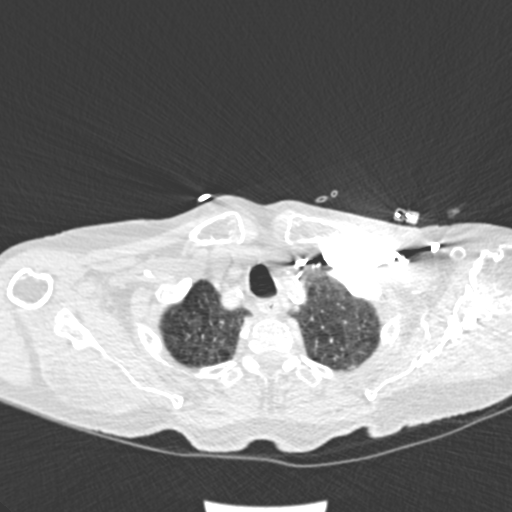

[Series 9: coronal mpr · coronal · 0.56mm/px · 2 of 79 slices shown]
[im 27/79  soft-tissue]
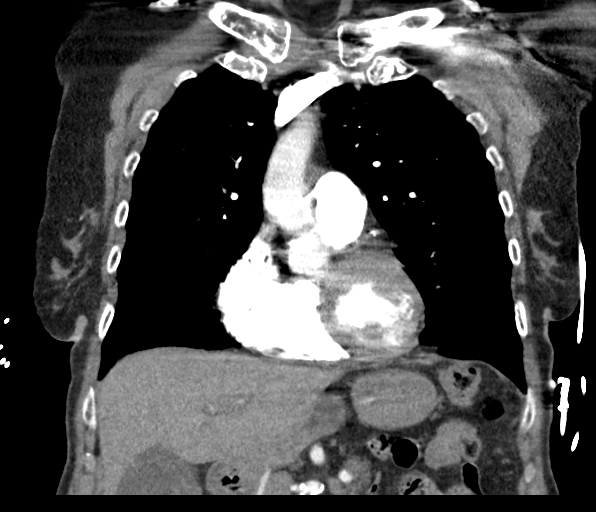
[im 53/79  soft-tissue]
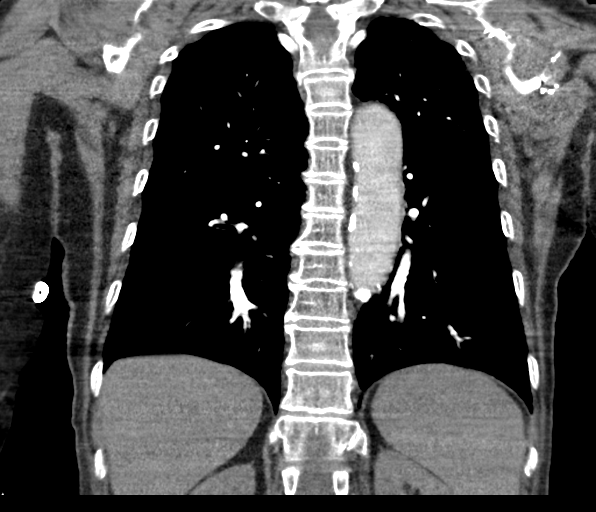

[17 of 46 positions shown; findings below may reference images not displayed]

RADIATION DOSE REDUCTION: This exam was performed according to the
departmental dose-optimization program which includes automated
exposure control, adjustment of the mA and/or kV according to
patient size and/or use of iterative reconstruction technique.

CONTRAST:  75mL OMNIPAQUE IOHEXOL 350 MG/ML SOLN
FINDINGS: Cardiovascular: Satisfactory opacification of the pulmonary arteries
to the segmental level. No evidence of pulmonary embolism. Normal
heart size. No pericardial effusion. No thoracic aortic aneurysm or
dissection. Coronary, aortic arch, and branch vessel atherosclerotic
vascular disease.

Mediastinum/Nodes: No enlarged mediastinal, hilar, or axillary lymph
nodes. Thyroid gland, trachea, and esophagus demonstrate no
significant findings.

Lungs/Pleura: Unchanged moderate centrilobular emphysema and chronic
diffuse peribronchial thickening. Unchanged 5 mm nodule in the right
upper lobe (series 8, image 43). Unchanged 3 and 4 mm nodules in the
right middle lobe (series 8, images 86 and 89). Previously seen 6 mm
nodule in the anterior left upper lobe currently measures 2 mm
(series 8, image 41). Unchanged calcified granuloma in the left
upper lobe. No focal consolidation, pleural effusion, or
pneumothorax.

Upper Abdomen: No acute abnormality.

Musculoskeletal: No chest wall abnormality. No acute or significant
osseous findings.

Review of the MIP images confirms the above findings.
IMPRESSION: 1. No evidence of pulmonary embolism. No acute intrathoracic
process.
2. Previously seen 6 mm nodule in the anterior left upper lobe
currently measures 2 mm, consistent with benign etiology. Other
pulmonary nodules in the right lung measuring up to 5 mm are
unchanged dating back to 9232, consistent with benign etiology.
3. Aortic Atherosclerosis (0NSMO-4Z8.8) and Emphysema (0NSMO-BE0.J).

## 2023-07-05 ENCOUNTER — Other Ambulatory Visit: Payer: Self-pay | Admitting: Radiation Oncology

## 2024-04-04 ENCOUNTER — Other Ambulatory Visit: Payer: Self-pay

## 2024-04-04 ENCOUNTER — Emergency Department (HOSPITAL_COMMUNITY)

## 2024-04-04 ENCOUNTER — Emergency Department (HOSPITAL_COMMUNITY)
Admission: EM | Admit: 2024-04-04 | Discharge: 2024-04-04 | Disposition: A | Discharge status: Home / Self Care | Source: Home / Self Care | Attending: Emergency Medicine | Admitting: Emergency Medicine

## 2024-04-04 DIAGNOSIS — Z7982 Long term (current) use of aspirin: Secondary | ICD-10-CM | POA: Diagnosis not present

## 2024-04-04 DIAGNOSIS — R079 Chest pain, unspecified: Secondary | ICD-10-CM | POA: Diagnosis present

## 2024-04-04 DIAGNOSIS — Z7951 Long term (current) use of inhaled steroids: Secondary | ICD-10-CM | POA: Insufficient documentation

## 2024-04-04 DIAGNOSIS — J449 Chronic obstructive pulmonary disease, unspecified: Secondary | ICD-10-CM | POA: Diagnosis not present

## 2024-04-04 DIAGNOSIS — R0789 Other chest pain: Secondary | ICD-10-CM | POA: Diagnosis not present

## 2024-04-04 DIAGNOSIS — Z8521 Personal history of malignant neoplasm of larynx: Secondary | ICD-10-CM | POA: Diagnosis not present

## 2024-04-04 DIAGNOSIS — M546 Pain in thoracic spine: Secondary | ICD-10-CM | POA: Insufficient documentation

## 2024-04-04 LAB — COMPREHENSIVE METABOLIC PANEL WITH GFR
ALT: 7 U/L (ref 0–44)
AST: 21 U/L (ref 15–41)
Albumin: 3.6 g/dL (ref 3.5–5.0)
Alkaline Phosphatase: 72 U/L (ref 38–126)
Anion gap: 10 (ref 5–15)
BUN: 16 mg/dL (ref 8–23)
CO2: 26 mmol/L (ref 22–32)
Calcium: 8.4 mg/dL — ABNORMAL LOW (ref 8.9–10.3)
Chloride: 102 mmol/L (ref 98–111)
Creatinine, Ser: 0.71 mg/dL (ref 0.44–1.00)
GFR, Estimated: >60 mL/min
Glucose, Bld: 120 mg/dL — ABNORMAL HIGH (ref 70–99)
Potassium: 4.5 mmol/L (ref 3.5–5.1)
Sodium: 138 mmol/L (ref 135–145)
Total Bilirubin: 0.4 mg/dL (ref 0.0–1.2)
Total Protein: 6.4 g/dL — ABNORMAL LOW (ref 6.5–8.1)

## 2024-04-04 LAB — CBC WITH DIFFERENTIAL/PLATELET
Abs Immature Granulocytes: 0.05 K/uL (ref 0.00–0.07)
Basophils Absolute: 0.1 K/uL (ref 0.0–0.1)
Basophils Relative: 1 %
Eosinophils Absolute: 0.1 K/uL (ref 0.0–0.5)
Eosinophils Relative: 1 %
HCT: 43.2 % (ref 36.0–46.0)
Hemoglobin: 13.4 g/dL (ref 12.0–15.0)
Immature Granulocytes: 1 %
Lymphocytes Relative: 17 %
Lymphs Abs: 1.5 K/uL (ref 0.7–4.0)
MCH: 26.7 pg (ref 26.0–34.0)
MCHC: 31 g/dL (ref 30.0–36.0)
MCV: 86.1 fL (ref 80.0–100.0)
Monocytes Absolute: 0.6 K/uL (ref 0.1–1.0)
Monocytes Relative: 7 %
Neutro Abs: 6.4 K/uL (ref 1.7–7.7)
Neutrophils Relative %: 73 %
Platelets: 249 K/uL (ref 150–400)
RBC: 5.02 MIL/uL (ref 3.87–5.11)
RDW: 14.8 % (ref 11.5–15.5)
WBC: 8.7 K/uL (ref 4.0–10.5)
nRBC: 0 % (ref 0.0–0.2)

## 2024-04-04 LAB — TROPONIN T, HIGH SENSITIVITY
Troponin T High Sensitivity: 20 ng/L — ABNORMAL HIGH (ref 0–19)
Troponin T High Sensitivity: 20 ng/L — ABNORMAL HIGH (ref 0–19)

## 2024-04-04 MED ORDER — MORPHINE SULFATE (PF) 4 MG/ML IV SOLN
4.0000 mg | Freq: Once | INTRAVENOUS | Status: AC
  Administered 2024-04-04: 4 mg via INTRAVENOUS
  Filled 2024-04-04: qty 1

## 2024-04-04 MED ORDER — HYDROMORPHONE HCL 1 MG/ML IJ SOLN
1.0000 mg | Freq: Once | INTRAMUSCULAR | Status: AC
  Administered 2024-04-04: 1 mg via INTRAVENOUS
  Filled 2024-04-04: qty 1

## 2024-04-04 MED ORDER — HYDROMORPHONE HCL 1 MG/ML IJ SOLN
2.0000 mg | Freq: Once | INTRAMUSCULAR | Status: AC
  Administered 2024-04-04: 2 mg via INTRAVENOUS
  Filled 2024-04-04: qty 2

## 2024-04-04 NOTE — Progress Notes (Signed)
 [facility] ED [address] [facility] Liaison Note  This patient is a current hospice patient with [facility] admitted with a terminal diagnosis Neoplasm of the glottis.  Patient is a DNR and Angel Hager [phone] is her primary caregiver.  We will continue to follow for any discharge planning needs and to coordinate continuation of hospice care.  Please don't hesitate to call with any hospice related questions or concerns.  Thank you for the opportunity to participate in this patient's care.  Inocente Jacobs RN BSN [facility] Liaison [phone]

## 2024-04-04 NOTE — Discharge Instructions (Signed)
 You were seen in the Emergency Department for chest pain There is no evidence of heart attack There is no broken ribs or pneumonia Her pain was well-controlled after 3 doses of pain medicine She should continue taking all previous prescribed indications at home Follow-up with your primary doctor Return to the ER for severe pain or trouble breathing

## 2024-04-04 NOTE — ED Provider Notes (Signed)
 " [place] [facility] AT [facility] Provider Note   CSN: [id number] Arrival date & time: 04/04/24  1615     Patient presents with: Chest Pain   Elizabeth Velazquez is a 83 y.o. female.  On hospice care with a history of glottis carcinoma COPD who presents to ED for chest pain.  Patient suffered a mechanical fall 2 days ago at home with hematoma to the right side of her head has had right sided chest pain and right sided back pain since the fall.  Home oxycodone  and methadone have been ineffective in providing adequate analgesia.  No shortness of breath on supplemental oxygen.  She uses 2 L nasal cannula as needed at home.  No fevers chills or other complaints at this time.    Chest Pain      Prior to Admission medications  Medication Sig Start Date End Date Taking? Authorizing Provider  albuterol  (PROVENTIL ) (2.5 MG/3ML) 0.083% nebulizer solution Take 2.5 mg by nebulization every 4 (four) hours as needed for shortness of breath.    [provider]  alendronate (FOSAMAX) 70 MG tablet Take 70 mg by mouth once a week. Take with a full glass of water on an empty stomach.    [provider]  amLODipine  (NORVASC ) 5 MG tablet TAKE 1 TABLET BY MOUTH DAILY 02/08/23   Patwardhan, Newman PARAS, MD  aspirin  EC 81 MG tablet Take 1 tablet (81 mg total) by mouth daily. 04/10/19   Patwardhan, Newman PARAS, MD  atorvastatin (LIPITOR) 20 MG tablet Take 20 mg by mouth daily.    [provider]  Cholecalciferol (VITAMIN D3) 125 MCG (5000 UT) TABS Take 5,000 Units by mouth daily.    [provider]  cyclobenzaprine (FLEXERIL) 10 MG tablet Take 10 mg by mouth 2 (two) times daily as needed for muscle spasms.    [provider]  docusate sodium (COLACE) 100 MG capsule Take 100 mg by mouth at bedtime as needed for mild constipation.    [provider]  gabapentin (NEURONTIN) 300 MG capsule Take 300 mg by mouth 2 (two) times daily. 06/21/21   [provider]  HYDROcodone-acetaminophen  (NORCO) 10-325 MG tablet Take 1 tablet by mouth 4 (four) times daily as needed. 06/06/21   [provider]  ibuprofen (ADVIL) 200 MG tablet Take 400 mg by mouth every 8 (eight) hours as needed for moderate pain.    [provider]  Menthol-Methyl Salicylate (SALONPAS PAIN RELIEF PATCH EX) Apply 1-2 patches topically daily as needed (pain).    [provider]  OXYGEN Inhale 3.5 L into the lungs daily as needed (Low oxygen).    [provider]  senna (SENOKOT) 8.6 MG TABS tablet Take 1 tablet by mouth daily as needed for mild constipation.    [provider]  SYMBICORT 160-4.5 MCG/ACT inhaler Inhale 2 puffs into the lungs 2 (two) times daily. 10/25/18   [provider]  Venlafaxine HCl 225 MG TB24 Take 225 mg by mouth daily with breakfast.    [provider]  zolpidem (AMBIEN) 5 MG tablet Take 5 mg by mouth at bedtime. 01/28/21   [provider]    Allergies: Bee venom, Azithromycin , Gabapentin, Lidocaine , Penicillins, and Serotonin reuptake inhibitors (ssris)    Review of Systems  Cardiovascular:  Positive for chest pain.    Updated Vital Signs BP (!) 145/87   Pulse 86   Temp 97.9 F (36.6 C) (Oral)   Resp 15  Ht 4' 9 (1.448 m)   Wt 36.3 kg   SpO2 93%   BMI 17.31 kg/m   Physical Exam Vitals and nursing note reviewed.  HENT:     Head: Normocephalic and atraumatic.  Eyes:     Pupils: Pupils are equal, round, and reactive to light.  Cardiovascular:     Rate and Rhythm: Normal rate and regular rhythm.  Pulmonary:     Effort: Pulmonary effort is normal.     Breath sounds: Normal breath sounds.  Abdominal:     Palpations: Abdomen is soft.     Tenderness: There is no abdominal tenderness.  Musculoskeletal:     Comments: Anterior chest wall tenderness, right lateral chest wall tenderness and paraspinal right thoracic tenderness with no midline tenderness step-off  deformity  Skin:    General: Skin is warm and dry.  Neurological:     Mental Status: She is alert.  Psychiatric:        Mood and Affect: Mood normal.     (all labs ordered are listed, but only abnormal results are displayed) Labs Reviewed  COMPREHENSIVE METABOLIC PANEL WITH GFR - Abnormal; Notable for the following components:      Result Value   Glucose, Bld 120 (*)    Calcium 8.4 (*)    Total Protein 6.4 (*)    All other components within normal limits  TROPONIN T, HIGH SENSITIVITY - Abnormal; Notable for the following components:   Troponin T High Sensitivity 20 (*)    All other components within normal limits  TROPONIN T, HIGH SENSITIVITY - Abnormal; Notable for the following components:   Troponin T High Sensitivity 20 (*)    All other components within normal limits  CBC WITH DIFFERENTIAL/PLATELET    EKG: EKG Interpretation Date/Time:  Tuesday April 04 2024 16:44:23 EST Ventricular Rate:  95 PR Interval:  133 QRS Duration:  97 QT Interval:  361 QTC Calculation: 454 R Axis:   71  Text Interpretation: Sinus arrhythmia Probable left atrial enlargement Borderline repolarization abnormality Confirmed by Pamella Sharper [phone]) on 04/04/2024 4:48:46 PM  Radiology: [facility] Ribs Unilateral W/Chest Right Result Date: 04/04/2024 CLINICAL DATA:  Fall and right knee pain. EXAM: RIGHT RIBS AND CHEST - 3+ VIEW COMPARISON:  None Available. FINDINGS: No focal consolidation, pleural effusion or pneumothorax. There is diffuse chronic interstitial coarsening and bronchitic changes. The cardiac silhouette is within normal limits. Atherosclerotic calcification of the aorta. No acute osseous pathology. No displaced rib fractures. IMPRESSION: 1. No acute cardiopulmonary process. 2. No displaced rib fractures. Electronically Signed   By: Vanetta Chou M.D.   On: 04/04/2024 18:44     Procedures   Medications Ordered in the ED  morphine  (PF) 4 MG/ML injection 4 mg (4 mg Intravenous Given  04/04/24 1649)  HYDROmorphone  (DILAUDID ) injection 1 mg (1 mg Intravenous Given 04/04/24 1804)  HYDROmorphone  (DILAUDID ) injection 2 mg (2 mg Intravenous Given 04/04/24 1847)    Clinical Course as of 04/04/24 2005  Tue Apr 04, 2024  2004 Initial troponin of 20 and delta of 20.  Low sufficient for ACS.  No dysrhythmia or ischemic changes on EKG.  No evidence of focal consolidation or rib fracture.  Read of laboratory workup unremarkable.  Pain well-controlled after 2 doses of Dilaudid  and 1 dose of morphine  here.  She will follow-up with her primary doctor regarding analgesia at home [MP]    Clinical Course User Index [MP] Pamella Sharper LABOR, DO  Medical Decision Making 82 year old female presenting for chest pain.  Fell 2 days ago.  Significant chest pain since then.  Substernal right lateral right thoracic pain.  May be rib fracture versus contusion.  Will obtain cardiac workup including EKG chest x-ray and labs.  Morphine  for pain control  Amount and/or Complexity of Data Reviewed Labs: ordered. Radiology: ordered.  Risk Prescription drug management.        Final diagnoses:  Chest pain, unspecified type    ED Discharge Orders     None          Pamella Ozell LABOR, DO 04/04/24 2005  "

## 2024-04-04 NOTE — ED Triage Notes (Signed)
 Patient arrives via [facility] for CP since 1300. Patient took oxycodone  and methadone at home prior to EMS arrival. On hospice care. Substernal chest pain with generalized pain all over. Fall on Sunday with hematoma to right side of head. Pain increasing with movement.  Patient uses home oxygen as needed , 2L [place].    12 lead unremarkable 99 on 2L [place] 156/86 BP HR 90 RR 20 126 CBG 18 LAC
